# Patient Record
Sex: Male | Born: 1955 | Race: White | Hispanic: No | Marital: Married | Smoking: Never smoker
Health system: Southern US, Community
[De-identification: ages and names within clinical notes are randomized; demographics above are authoritative.]

## PROBLEM LIST (undated history)

## (undated) DIAGNOSIS — I110 Hypertensive heart disease with heart failure: Secondary | ICD-10-CM

## (undated) DIAGNOSIS — Z789 Other specified health status: Secondary | ICD-10-CM

## (undated) DIAGNOSIS — I5022 Chronic systolic (congestive) heart failure: Secondary | ICD-10-CM

## (undated) DIAGNOSIS — E8881 Metabolic syndrome: Secondary | ICD-10-CM

## (undated) DIAGNOSIS — N200 Calculus of kidney: Secondary | ICD-10-CM

## (undated) DIAGNOSIS — E785 Hyperlipidemia, unspecified: Secondary | ICD-10-CM

## (undated) HISTORY — DX: Calculus of kidney: N20.0

## (undated) HISTORY — DX: Hypertensive heart disease with heart failure: I11.0

## (undated) HISTORY — DX: Chronic systolic (congestive) heart failure: I50.22

## (undated) HISTORY — DX: Metabolic syndrome: E88.81

## (undated) HISTORY — DX: Hyperlipidemia, unspecified: E78.5

## (undated) HISTORY — DX: Other specified health status: Z78.9

---

## 1974-02-06 HISTORY — PX: HUMERUS FRACTURE SURGERY: SHX670

## 1974-02-06 HISTORY — PX: FOREARM FRACTURE SURGERY: SHX649

## 2005-02-06 HISTORY — PX: BACK SURGERY: SHX140

## 2016-07-04 DIAGNOSIS — Z789 Other specified health status: Secondary | ICD-10-CM

## 2016-07-04 HISTORY — DX: Other specified health status: Z78.9

## 2017-08-02 DIAGNOSIS — E785 Hyperlipidemia, unspecified: Secondary | ICD-10-CM | POA: Insufficient documentation

## 2017-08-02 DIAGNOSIS — E8881 Metabolic syndrome: Secondary | ICD-10-CM

## 2017-08-02 DIAGNOSIS — N200 Calculus of kidney: Secondary | ICD-10-CM

## 2017-08-02 DIAGNOSIS — I10 Essential (primary) hypertension: Secondary | ICD-10-CM

## 2017-08-02 DIAGNOSIS — I509 Heart failure, unspecified: Secondary | ICD-10-CM

## 2017-08-02 DIAGNOSIS — I5022 Chronic systolic (congestive) heart failure: Secondary | ICD-10-CM | POA: Insufficient documentation

## 2017-08-02 DIAGNOSIS — I110 Hypertensive heart disease with heart failure: Secondary | ICD-10-CM | POA: Insufficient documentation

## 2017-08-02 HISTORY — DX: Chronic systolic (congestive) heart failure: I50.22

## 2017-08-02 HISTORY — DX: Metabolic syndrome: E88.810

## 2017-08-02 HISTORY — DX: Calculus of kidney: N20.0

## 2017-08-02 HISTORY — DX: Hypertensive heart disease with heart failure: I11.0

## 2017-08-02 HISTORY — DX: Metabolic syndrome: E88.81

## 2017-08-02 HISTORY — DX: Heart failure, unspecified: I50.9

## 2017-08-02 HISTORY — DX: Hyperlipidemia, unspecified: E78.5

## 2017-08-02 HISTORY — DX: Essential (primary) hypertension: I10

## 2017-08-17 ENCOUNTER — Ambulatory Visit: Payer: Self-pay | Admitting: Cardiology

## 2017-09-04 ENCOUNTER — Encounter: Payer: Self-pay | Admitting: *Deleted

## 2017-09-11 NOTE — Progress Notes (Signed)
Cardiology Office Note:    Date:  09/12/2017   ID:  Victor Hood, DOB 12/12/1955, MRN [id number]  PCP:  Buckner Malta, MD  Cardiologist:  Norman Herrlich, MD   Referring MD: Buckner Malta, MD  ASSESSMENT:    1. Chronic left systolic heart failure (HCC)   2. Hypertensive heart disease with heart failure (HCC)   3. Hyperlipidemia, unspecified hyperlipidemia type    PLAN:    In order of problems listed above:  1. Stable his heart failure is nicely compensated New York Heart Association class I-II he has no fluid overload continue current treatment including loop diuretic beta-blocker ACE inhibitor recheck echocardiogram and BNP level.  If EF remains less than 40% he would benefit from transition ACE inhibitor to ARNI.  He will continue his sodiumrestriction and self-management 2. Stable blood pressure target continue current treatment check renal function at risk for hypokalemia and renal insufficiency of loop diuretic and ACE 3. Although not listed either in our office or his PCP office he is taking unknown dose of pravastatin for hyperlipidemia check liver function for safety LDL for efficacy.  He is having trouble tolerating it and I asked him to bring it to the office next time so we know his dose and can look at other alternatives depending on his LDL cholesterol  Next appointment 6 weeks   Medication Adjustments/Labs and Tests Ordered: Current medicines are reviewed at length with the patient today.  Concerns regarding medicines are outlined above.  No orders of the defined types were placed in this encounter.  No orders of the defined types were placed in this encounter.    Chief Complaint  Patient presents with  . Establish Care  . Congestive Heart Failure  . Hypertension  6 weeks  History of Present Illness:    Victor Hood is a 62 y.o. male who is being seen today for the evaluation of heart failure at the request of Buckner Malta, MD. I had seen him in  the range of 3 to 4 years ago outside of the epic system for heart failure he was treated guideline medical therapy and is done well and has had no shortness of breath edema hospitalization chest pain palpitation or syncope.  He has good healthcare literacy sodium restricts weighs daily and is compliant with medications.  He has had no recent cardiac testing or laboratory test performed.  He has no known valvular disease or CAD.  He has had no chest pain or exercise intolerance no palpitation or syncope  Past Medical History:  Diagnosis Date  . Chronic left systolic heart failure (HCC) 08/02/2017  . Hyperlipidemia 08/02/2017  . Hypertensive heart disease with heart failure (HCC) 08/02/2017  . Kidney stone 08/02/2017  . Metabolic syndrome 08/02/2017  . Statin intolerance 07/04/2016    Past Surgical History:  Procedure Laterality Date  . BACK SURGERY  2007   decrompressed herniated disc  . FOREARM FRACTURE SURGERY Left 1976  . HUMERUS FRACTURE SURGERY Left 1976    Current Medications: Current Meds  Medication Sig  . aspirin EC 81 MG tablet Take 81 mg by mouth daily.  . furosemide (LASIX) 40 MG tablet Take 40 mg by mouth daily.  Marland Kitchen lisinopril (PRINIVIL,ZESTRIL) 20 MG tablet Take 40 mg by mouth daily.  . metoprolol tartrate (LOPRESSOR) 25 MG tablet Take 25 mg by mouth 2 (two) times daily.     Allergies:   Tape   Social History   Socioeconomic History  . Marital status: Single  Spouse name: Not on file  . Number of children: Not on file  . Years of education: Not on file  . Highest education level: Not on file  Occupational History  . Not on file  Social Needs  . Financial resource strain: Not on file  . Food insecurity:    Worry: Not on file    Inability: Not on file  . Transportation needs:    Medical: Not on file    Non-medical: Not on file  Tobacco Use  . Smoking status: Never Smoker  . Smokeless tobacco: Never Used  Substance and Sexual Activity  . Alcohol use: Yes     Alcohol/week: 4.2 - 8.4 oz    Types: 7 - 14 Cans of beer per week  . Drug use: Not on file  . Sexual activity: Not on file  Lifestyle  . Physical activity:    Days per week: Not on file    Minutes per session: Not on file  . Stress: Not on file  Relationships  . Social connections:    Talks on phone: Not on file    Gets together: Not on file    Attends religious service: Not on file    Active member of club or organization: Not on file    Attends meetings of clubs or organizations: Not on file    Relationship status: Not on file  Other Topics Concern  . Not on file  Social History Narrative  . Not on file     Family History: The patient's family history includes CVA in his mother; Congestive Heart Failure in his father and mother; Diabetes in his mother; Hypercholesterolemia in his maternal grandfather, maternal grandmother, and mother; Pancreatic cancer in his paternal uncle; Prostate cancer in his paternal uncle.  ROS:   Review of Systems  Constitution: Negative.  HENT: Negative.   Eyes: Negative.   Cardiovascular: Negative.   Respiratory: Negative.   Endocrine: Negative.   Hematologic/Lymphatic: Negative.   Skin: Negative.   Musculoskeletal: Positive for myalgias.  Gastrointestinal: Negative.   Genitourinary: Negative.   Neurological: Negative.   Psychiatric/Behavioral: Negative.   Allergic/Immunologic: Negative.    Please see the history of present illness.     All other systems reviewed and are negative.  EKGs/Labs/Other Studies Reviewed:    The following studies were reviewed today:   EKG:  EKG is  ordered today.  The ekg ordered today demonstrates sinus rhythm and is normal I had seen him in the range of  Recent Labs: No results found for requested labs within last 8760 hours.  Recent Lipid Panel No results found for: CHOL, TRIG, HDL, CHOLHDL, VLDL, LDLCALC, LDLDIRECT  Physical Exam:    VS:  BP 140/86 (BP Location: Left Arm, Patient Position:  Sitting, Cuff Size: Normal)   Pulse (!) 53   Ht 5' 9.5" (1.765 m)   Wt 192 lb (87.1 kg)   SpO2 94%   BMI 27.95 kg/m     Wt Readings from Last 3 Encounters:  09/12/17 192 lb (87.1 kg)     GEN:  Well nourished, well developed in no acute distress HEENT: Normal NECK: No JVD; No carotid bruits LYMPHATICS: No lymphadenopathy CARDIAC: RRR, no murmurs, rubs, gallops RESPIRATORY:  Clear to auscultation without rales, wheezing or rhonchi  ABDOMEN: Soft, non-tender, non-distended MUSCULOSKELETAL:  No edema; No deformity  SKIN: Warm and dry NEUROLOGIC:  Alert and oriented x 3 PSYCHIATRIC:  Normal affect     Signed, Norman Herrlich, MD  09/12/2017 10:26  AM    [place]

## 2017-09-12 ENCOUNTER — Ambulatory Visit (INDEPENDENT_AMBULATORY_CARE_PROVIDER_SITE_OTHER): Payer: Self-pay | Admitting: Cardiology

## 2017-09-12 ENCOUNTER — Encounter: Payer: Self-pay | Admitting: Cardiology

## 2017-09-12 VITALS — BP 140/86 | HR 53 | Ht 69.5 in | Wt 192.0 lb

## 2017-09-12 DIAGNOSIS — I5022 Chronic systolic (congestive) heart failure: Secondary | ICD-10-CM

## 2017-09-12 DIAGNOSIS — E785 Hyperlipidemia, unspecified: Secondary | ICD-10-CM

## 2017-09-12 DIAGNOSIS — I110 Hypertensive heart disease with heart failure: Secondary | ICD-10-CM

## 2017-09-12 LAB — LIPID PANEL
CHOL/HDL RATIO: 6.1 ratio — AB (ref 0.0–5.0)
Cholesterol, Total: 196 mg/dL (ref 100–199)
HDL: 32 mg/dL — ABNORMAL LOW (ref >39)
LDL CALC: 112 mg/dL — AB (ref 0–99)
TRIGLYCERIDES: 262 mg/dL — AB (ref 0–149)
VLDL Cholesterol Cal: 52 mg/dL — ABNORMAL HIGH (ref 5–40)

## 2017-09-12 LAB — COMPREHENSIVE METABOLIC PANEL
A/G RATIO: 1.4 (ref 1.2–2.2)
ALBUMIN: 4.1 g/dL (ref 3.6–4.8)
ALT: 11 IU/L (ref 0–44)
AST: 11 IU/L (ref 0–40)
Alkaline Phosphatase: 93 IU/L (ref 39–117)
BUN / CREAT RATIO: 14 (ref 10–24)
BUN: 14 mg/dL (ref 8–27)
Bilirubin Total: 0.5 mg/dL (ref 0.0–1.2)
CALCIUM: 9.6 mg/dL (ref 8.6–10.2)
CHLORIDE: 103 mmol/L (ref 96–106)
CO2: 24 mmol/L (ref 20–29)
Creatinine, Ser: 1.03 mg/dL (ref 0.76–1.27)
GFR, EST AFRICAN AMERICAN: 89 mL/min/{1.73_m2} (ref >59)
GFR, EST NON AFRICAN AMERICAN: 77 mL/min/{1.73_m2} (ref >59)
GLOBULIN, TOTAL: 2.9 g/dL (ref 1.5–4.5)
Glucose: 106 mg/dL — ABNORMAL HIGH (ref 65–99)
POTASSIUM: 4.6 mmol/L (ref 3.5–5.2)
SODIUM: 142 mmol/L (ref 134–144)
TOTAL PROTEIN: 7 g/dL (ref 6.0–8.5)

## 2017-09-12 LAB — PRO B NATRIURETIC PEPTIDE: NT-Pro BNP: 57 pg/mL (ref 0–210)

## 2017-09-12 NOTE — Patient Instructions (Signed)
Medication Instructions:  Your physician recommends that you continue on your current medications as directed. Please refer to the Current Medication list given to you today.   Labwork: Your physician recommends that you return for lab work today: CMP, BNP, and Lipids.  Testing/Procedures: Your physician has requested that you have an echocardiogram. Echocardiography is a painless test that uses sound waves to create images of your heart. It provides your doctor with information about the size and shape of your heart and how well your heart's chambers and valves are working. This procedure takes approximately one hour. There are no restrictions for this procedure.    Follow-Up: Your physician recommends that you schedule a follow-up appointment in: 6 weeks.    Any Other Special Instructions Will Be Listed Below (If Applicable).     If you need a refill on your cardiac medications before your next appointment, please call your pharmacy.  Echocardiogram An echocardiogram, or echocardiography, uses sound waves (ultrasound) to produce an image of your heart. The echocardiogram is simple, painless, obtained within a short period of time, and offers valuable information to your health care provider. The images from an echocardiogram can provide information such as:  Evidence of coronary artery disease (CAD).  Heart size.  Heart muscle function.  Heart valve function.  Aneurysm detection.  Evidence of a past heart attack.  Fluid buildup around the heart.  Heart muscle thickening.  Assess heart valve function.  Tell a health care provider about:  Any allergies you have.  All medicines you are taking, including vitamins, herbs, eye drops, creams, and over-the-counter medicines.  Any problems you or family members have had with anesthetic medicines.  Any blood disorders you have.  Any surgeries you have had.  Any medical conditions you have.  Whether you are pregnant  or may be pregnant. What happens before the procedure? No special preparation is needed. Eat and drink normally. What happens during the procedure?  In order to produce an image of your heart, gel will be applied to your chest and a wand-like tool (transducer) will be moved over your chest. The gel will help transmit the sound waves from the transducer. The sound waves will harmlessly bounce off your heart to allow the heart images to be captured in real-time motion. These images will then be recorded.  You may need an IV to receive a medicine that improves the quality of the pictures. What happens after the procedure? You may return to your normal schedule including diet, activities, and medicines, unless your health care provider tells you otherwise. This information is not intended to replace advice given to you by your health care provider. Make sure you discuss any questions you have with your health care provider. Document Released: 01/21/2000 Document Revised: 09/11/2015 Document Reviewed: 09/30/2012 Elsevier Interactive Patient Education  2017 [employer].

## 2017-10-25 ENCOUNTER — Other Ambulatory Visit: Payer: Self-pay

## 2017-10-29 ENCOUNTER — Ambulatory Visit: Payer: Self-pay | Admitting: Cardiology

## 2017-12-06 ENCOUNTER — Other Ambulatory Visit: Payer: Self-pay

## 2017-12-11 NOTE — Progress Notes (Deleted)
Cardiology Office Note:    Date:  12/11/2017   ID:  Victor Hood, DOB Dec 23, 1955, MRN [id number]  PCP:  Buckner Malta, MD  Cardiologist:  Norman Herrlich, MD    Referring MD: Buckner Malta, MD    ASSESSMENT:    No diagnosis found. PLAN:    In order of problems listed above:  1. ***   Next appointment: ***   Medication Adjustments/Labs and Tests Ordered: Current medicines are reviewed at length with the patient today.  Concerns regarding medicines are outlined above.  No orders of the defined types were placed in this encounter.  No orders of the defined types were placed in this encounter.   No chief complaint on file.   History of Present Illness:    Victor Hood is a 62 y.o. male with a hx of heart failure pretension and hyper lipidemia last seen 09/12/2017.  proBNP level is very low less than 100 and echocardiogram was ordered but not performed Compliance with diet, lifestyle and medications: *** Past Medical History:  Diagnosis Date  . Chronic left systolic heart failure (HCC) 08/02/2017  . Hyperlipidemia 08/02/2017  . Hypertensive heart disease with heart failure (HCC) 08/02/2017  . Kidney stone 08/02/2017  . Metabolic syndrome 08/02/2017  . Statin intolerance 07/04/2016    Past Surgical History:  Procedure Laterality Date  . BACK SURGERY  2007   decrompressed herniated disc  . FOREARM FRACTURE SURGERY Left 1976  . HUMERUS FRACTURE SURGERY Left 1976    Current Medications: No outpatient medications have been marked as taking for the 12/12/17 encounter (Appointment) with Baldo Daub, MD.     Allergies:   Tape   Social History   Socioeconomic History  . Marital status: Single    Spouse name: Not on file  . Number of children: Not on file  . Years of education: Not on file  . Highest education level: Not on file  Occupational History  . Not on file  Social Needs  . Financial resource strain: Not on file  . Food insecurity:    Worry:  Not on file    Inability: Not on file  . Transportation needs:    Medical: Not on file    Non-medical: Not on file  Tobacco Use  . Smoking status: Never Smoker  . Smokeless tobacco: Never Used  Substance and Sexual Activity  . Alcohol use: Yes    Alcohol/week: 7.0 - 14.0 standard drinks    Types: 7 - 14 Cans of beer per week  . Drug use: Not on file  . Sexual activity: Not on file  Lifestyle  . Physical activity:    Days per week: Not on file    Minutes per session: Not on file  . Stress: Not on file  Relationships  . Social connections:    Talks on phone: Not on file    Gets together: Not on file    Attends religious service: Not on file    Active member of club or organization: Not on file    Attends meetings of clubs or organizations: Not on file    Relationship status: Not on file  Other Topics Concern  . Not on file  Social History Narrative  . Not on file     Family History: The patient's ***family history includes CVA in his mother; Congestive Heart Failure in his father and mother; Diabetes in his mother; Hypercholesterolemia in his maternal grandfather, maternal grandmother, and mother; Pancreatic cancer in his paternal uncle; Prostate  cancer in his paternal uncle. ROS:   Please see the history of present illness.    All other systems reviewed and are negative.  EKGs/Labs/Other Studies Reviewed:    The following studies were reviewed today:  EKG:  EKG ordered today.  The ekg ordered today demonstrates ***  Recent Labs: 09/12/2017: ALT 11; BUN 14; Creatinine, Ser 1.03; NT-Pro BNP 57; Potassium 4.6; Sodium 142  Recent Lipid Panel    Component Value Date/Time   CHOL 196 09/12/2017 1034   TRIG 262 (H) 09/12/2017 1034   HDL 32 (L) 09/12/2017 1034   CHOLHDL 6.1 (H) 09/12/2017 1034   LDLCALC 112 (H) 09/12/2017 1034    Physical Exam:    VS:  There were no vitals taken for this visit.    Wt Readings from Last 3 Encounters:  09/12/17 192 lb (87.1 kg)      GEN: *** Well nourished, well developed in no acute distress HEENT: Normal NECK: No JVD; No carotid bruits LYMPHATICS: No lymphadenopathy CARDIAC: ***RRR, no murmurs, rubs, gallops RESPIRATORY:  Clear to auscultation without rales, wheezing or rhonchi  ABDOMEN: Soft, non-tender, non-distended MUSCULOSKELETAL:  No edema; No deformity  SKIN: Warm and dry NEUROLOGIC:  Alert and oriented x 3 PSYCHIATRIC:  Normal affect    Signed, Norman Herrlich, MD  12/11/2017 3:41 PM    [facility]

## 2017-12-12 ENCOUNTER — Ambulatory Visit: Payer: Self-pay | Admitting: Cardiology

## 2018-12-09 ENCOUNTER — Other Ambulatory Visit: Payer: Self-pay | Admitting: Cardiology

## 2018-12-09 DIAGNOSIS — I5022 Chronic systolic (congestive) heart failure: Secondary | ICD-10-CM

## 2018-12-09 DIAGNOSIS — I110 Hypertensive heart disease with heart failure: Secondary | ICD-10-CM

## 2019-08-27 DIAGNOSIS — Z9181 History of falling: Secondary | ICD-10-CM | POA: Diagnosis not present

## 2019-08-27 DIAGNOSIS — E7800 Pure hypercholesterolemia, unspecified: Secondary | ICD-10-CM | POA: Diagnosis not present

## 2019-08-27 DIAGNOSIS — Z1331 Encounter for screening for depression: Secondary | ICD-10-CM | POA: Diagnosis not present

## 2019-08-27 DIAGNOSIS — Z789 Other specified health status: Secondary | ICD-10-CM | POA: Diagnosis not present

## 2019-08-27 DIAGNOSIS — I10 Essential (primary) hypertension: Secondary | ICD-10-CM | POA: Diagnosis not present

## 2019-08-27 DIAGNOSIS — I5022 Chronic systolic (congestive) heart failure: Secondary | ICD-10-CM | POA: Diagnosis not present

## 2019-09-05 ENCOUNTER — Encounter: Payer: Self-pay | Admitting: General Practice

## 2019-10-07 DIAGNOSIS — Z0000 Encounter for general adult medical examination without abnormal findings: Secondary | ICD-10-CM | POA: Diagnosis not present

## 2019-11-10 ENCOUNTER — Other Ambulatory Visit: Payer: Self-pay

## 2019-11-10 ENCOUNTER — Encounter: Payer: Self-pay | Admitting: Cardiology

## 2019-11-10 ENCOUNTER — Ambulatory Visit (INDEPENDENT_AMBULATORY_CARE_PROVIDER_SITE_OTHER): Payer: PPO | Admitting: Cardiology

## 2019-11-10 VITALS — BP 156/95 | HR 72 | Ht 69.5 in | Wt 190.8 lb

## 2019-11-10 DIAGNOSIS — I110 Hypertensive heart disease with heart failure: Secondary | ICD-10-CM | POA: Diagnosis not present

## 2019-11-10 DIAGNOSIS — I5022 Chronic systolic (congestive) heart failure: Secondary | ICD-10-CM | POA: Diagnosis not present

## 2019-11-10 DIAGNOSIS — E785 Hyperlipidemia, unspecified: Secondary | ICD-10-CM

## 2019-11-10 NOTE — Progress Notes (Signed)
Cardiology Office Note:    Date:  11/10/2019   ID:  Victor Hood, DOB [birth year], MRN [id number]  PCP:  Buckner Malta, MD  Cardiologist:  Norman Herrlich, MD    Referring MD: Buckner Malta, MD    ASSESSMENT:    1. Chronic left systolic heart failure (HCC)   2. Hypertensive heart disease with heart failure (HCC)   3. Hyperlipidemia, unspecified hyperlipidemia type    PLAN:    In order of problems listed above:  1. Overall he is done well with noncompliance of medications since his last visit.  I told him I suspect with an abnormal EKG and symptoms that his left ventricular function is not improved may be worsened and requires guideline directed treatment.  For further evaluation we will check a proBNP level and echocardiogram.  If EF remains reduced appropriate therapy would include MRA Entresto SGLT2 inhibitor and a loop diuretic if he remains symptomatic or interested volume overload. 2. Repeat blood pressure by me 142/80 in the office I encouraged him to check his blood pressure at home and await the results of testing for decision on other treatment which would impact his blood pressure such as Entresto and MRA 3. Not presently on a statin   Next appointment: 6 weeks or sooner depending on the results of testing   Medication Adjustments/Labs and Tests Ordered: Current medicines are reviewed at length with the patient today.  Concerns regarding medicines are outlined above.  Orders Placed This Encounter  Procedures  . Basic metabolic panel  . Pro b natriuretic peptide (BNP)  . EKG 12-Lead  . ECHOCARDIOGRAM COMPLETE   No orders of the defined types were placed in this encounter.   Chief Complaint  Patient presents with  . Follow-up  . Congestive Heart Failure    History of Present Illness:    Victor Hood is a 64 y.o. male with a hx of chronic systolic heart failure hypertensive heart disease and hyperlipidemia and statin intolerance.  He was last seen  09/12/2017.  At that time echocardiogram was ordered to guide therapy he did not follow-up to have the test performed or in the office afterwards.  Compliance with diet, lifestyle and medications: yes  He is here for multiple reasons, sent by his PCP after an EKG was done in the office EKG here today shows left bundle branch block. Soon after seeing me he stopped his diuretic and beta-blocker. 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 He has no edema orthopnea no chest pain palpitation or syncope. 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  Past Medical History:  Diagnosis Date  . Chronic left systolic heart failure (HCC) 08/02/2017  . Hyperlipidemia 08/02/2017  . Hypertensive heart disease with heart failure (HCC) 08/02/2017  . Kidney stone 08/02/2017  . Metabolic syndrome 08/02/2017  . Statin intolerance 07/04/2016    Past Surgical History:  Procedure Laterality Date  . BACK SURGERY  2007   decrompressed herniated disc  . FOREARM FRACTURE SURGERY Left 1976  . HUMERUS FRACTURE SURGERY Left 1976    Current Medications: Current Meds  Medication Sig  . aspirin EC 81 MG tablet Take 81 mg by mouth daily.  Marland Kitchen  lisinopril (PRINIVIL,ZESTRIL) 20 MG tablet Take 40 mg by mouth daily.     Allergies:   Tape   Social History   Socioeconomic History  . Marital status: Single    Spouse name: Not on file  . Number of children: Not on file  . Years of education: Not on file  . Highest education level: Not on file  Occupational History  . Not on file  Tobacco Use  . Smoking status: Never Smoker  . Smokeless tobacco: Never Used  Substance and Sexual Activity  . Alcohol use: Yes     Alcohol/week: 7.0 - 14.0 standard drinks    Types: 7 - 14 Cans of beer per week  . Drug use: Not on file  . Sexual activity: Not on file  Other Topics Concern  . Not on file  Social History Narrative  . Not on file   Social Determinants of Health   Financial Resource Strain:   . Difficulty of Paying Living Expenses: Not on file  Food Insecurity:   . Worried About Programme researcher, broadcasting/film/video in the Last Year: Not on file  . Ran Out of Food in the Last Year: Not on file  Transportation Needs:   . Lack of Transportation (Medical): Not on file  . Lack of Transportation (Non-Medical): Not on file  Physical Activity:   . Days of Exercise per Week: Not on file  . Minutes of Exercise per Session: Not on file  Stress:   . Feeling of Stress : Not on file  Social Connections:   . Frequency of Communication with Friends and Family: Not on file  . Frequency of Social Gatherings with Friends and Family: Not on file  . Attends Religious Services: Not on file  . Active Member of Clubs or Organizations: Not on file  . Attends Banker Meetings: Not on file  . Marital Status: Not on file     Family History: The patient's family history includes CVA in his mother; Congestive Heart Failure in his father and mother; Diabetes in his mother; Hypercholesterolemia in his maternal grandfather, maternal grandmother, and mother; Pancreatic cancer in his paternal uncle; Prostate cancer in his paternal uncle. ROS:   Please see the history of present illness.    All other systems reviewed and are negative.  EKGs/Labs/Other Studies Reviewed:    The following studies were reviewed today:  EKG:  EKG ordered today and personally reviewed.  The ekg ordered today demonstrates Yes  Recent Labs: No results found for requested labs within last 8760 hours.  Recent Lipid Panel    Component Value Date/Time   CHOL 196 09/12/2017 1034   TRIG 262 (H) 09/12/2017 1034   HDL 32 (L) 09/12/2017 1034   CHOLHDL  6.1 (H) 09/12/2017 1034   LDLCALC 112 (H) 09/12/2017 1034    Physical Exam:    VS:  BP (!) 156/95   Pulse 72   Ht 5' 9.5" (1.765 m)   Wt 190 lb 12.8 oz (86.5 kg)   SpO2 96%   BMI 27.77 kg/m     Wt Readings from Last 3 Encounters:  11/10/19 190 lb 12.8 oz (86.5 kg)  09/12/17 192 lb (87.1 kg)     GEN:  Well nourished, well developed in no acute distress HEENT: Normal NECK: No JVD; No carotid bruits LYMPHATICS: No lymphadenopathy CARDIAC: RRR, no murmurs, rubs, gallops RESPIRATORY:  Clear to auscultation without rales, wheezing or rhonchi  ABDOMEN: Soft, non-tender, non-distended MUSCULOSKELETAL:  No edema; No  deformity  SKIN: Warm and dry NEUROLOGIC:  Alert and oriented x 3 PSYCHIATRIC:  Normal affect    Signed, Norman Herrlich, MD  11/10/2019 4:52 PM    [facility]

## 2019-11-10 NOTE — Patient Instructions (Signed)
Medication Instructions:  Your physician recommends that you continue on your current medications as directed. Please refer to the Current Medication list given to you today.  *If you need a refill on your cardiac medications before your next appointment, please call your pharmacy*   Lab Work: Your physician recommends that you return for lab work in: TODAY BMP, ProBNP If you have labs (blood work) drawn today and your tests are completely normal, you will receive your results only by: . MyChart Message (if you have MyChart) OR . A paper copy in the mail If you have any lab test that is abnormal or we need to change your treatment, we will call you to review the results.   Testing/Procedures: Your physician has requested that you have an echocardiogram. Echocardiography is a painless test that uses sound waves to create images of your heart. It provides your doctor with information about the size and shape of your heart and how well your heart's chambers and valves are working. This procedure takes approximately one hour. There are no restrictions for this procedure.     Follow-Up: At [facility], you and your health needs are our priority.  As part of our continuing mission to provide you with exceptional heart care, we have created designated Provider Care Teams.  These Care Teams include your primary Cardiologist (physician) and Advanced Practice Providers (APPs -  Physician Assistants and Nurse Practitioners) who all work together to provide you with the care you need, when you need it.  We recommend signing up for the patient portal called "MyChart".  Sign up information is provided on this After Visit Summary.  MyChart is used to connect with patients for Virtual Visits (Telemedicine).  Patients are able to view lab/test results, encounter notes, upcoming appointments, etc.  Non-urgent messages can be sent to your provider as well.   To learn more about what you can do with MyChart,  go to [email or web address].    Your next appointment:   6 week(s)  The format for your next appointment:   In Person  Provider:   Brian Munley, MD   Other Instructions   

## 2019-11-11 LAB — BASIC METABOLIC PANEL
BUN/Creatinine Ratio: 9 — ABNORMAL LOW (ref 10–24)
BUN: 10 mg/dL (ref 8–27)
CO2: 22 mmol/L (ref 20–29)
Calcium: 9.5 mg/dL (ref 8.6–10.2)
Chloride: 102 mmol/L (ref 96–106)
Creatinine, Ser: 1.1 mg/dL (ref 0.76–1.27)
GFR calc Af Amer: 81 mL/min/{1.73_m2} (ref >59)
GFR calc non Af Amer: 70 mL/min/{1.73_m2} (ref >59)
Glucose: 88 mg/dL (ref 65–99)
Potassium: 5.1 mmol/L (ref 3.5–5.2)
Sodium: 140 mmol/L (ref 134–144)

## 2019-11-11 LAB — PRO B NATRIURETIC PEPTIDE: NT-Pro BNP: 500 pg/mL — ABNORMAL HIGH (ref 0–376)

## 2019-12-02 ENCOUNTER — Other Ambulatory Visit: Payer: Self-pay

## 2019-12-02 ENCOUNTER — Ambulatory Visit (INDEPENDENT_AMBULATORY_CARE_PROVIDER_SITE_OTHER): Payer: PPO

## 2019-12-02 DIAGNOSIS — I5022 Chronic systolic (congestive) heart failure: Secondary | ICD-10-CM | POA: Diagnosis not present

## 2019-12-02 DIAGNOSIS — E785 Hyperlipidemia, unspecified: Secondary | ICD-10-CM | POA: Diagnosis not present

## 2019-12-02 DIAGNOSIS — I110 Hypertensive heart disease with heart failure: Secondary | ICD-10-CM | POA: Diagnosis not present

## 2019-12-02 LAB — ECHOCARDIOGRAM COMPLETE
Calc EF: 27.4 %
S' Lateral: 3.7 cm
Single Plane A2C EF: 28 %
Single Plane A4C EF: 28.4 %

## 2019-12-02 NOTE — Progress Notes (Signed)
Complete echocardiogram performed.  Jimmy Dim Meisinger RDCS, RVT  

## 2019-12-03 ENCOUNTER — Telehealth: Payer: Self-pay | Admitting: *Deleted

## 2019-12-03 ENCOUNTER — Telehealth: Payer: Self-pay | Admitting: Emergency Medicine

## 2019-12-03 DIAGNOSIS — Z79899 Other long term (current) drug therapy: Secondary | ICD-10-CM

## 2019-12-03 MED ORDER — CARVEDILOL 6.25 MG PO TABS: 6.2500 mg | ORAL_TABLET | Freq: Two times a day (BID) | ORAL | 1 refills | Status: DC

## 2019-12-03 MED ORDER — ENTRESTO 24-26 MG PO TABS: 1.0000 | ORAL_TABLET | Freq: Two times a day (BID) | ORAL | 2 refills | Status: DC

## 2019-12-03 NOTE — Telephone Encounter (Signed)
PA for entresto came back Approved. Entresto 24 MG-26 MG TABLET will be approved from 12/03/19 to 12/02/2020

## 2019-12-03 NOTE — Telephone Encounter (Signed)
Called patient. Informed her of results. Informed him to start carvedilol 6.25 mg twice daily.   Informed him to stop lisinopril. Patient advised he must be off lisinopril for 3 days before he can start entresto. On the 4th day patient advised to start entresto 24/26 mg twice daily.   Patient informed to come have labs drawn in 1 week after being on meds. He verbally understood. No further questions.

## 2019-12-03 NOTE — Telephone Encounter (Signed)
A prior Berkley Harvey has been started for [facility] 24-26 on this pt, Key: [id number]

## 2019-12-03 NOTE — Telephone Encounter (Signed)
-----   Message from Thomasene Ripple, DO sent at 12/02/2019  9:45 PM EDT ----- Start the patient on carvedilol 6.25 mg twice daily.    Ask him to stop his lisinopril wait for 3 days for 3 days.  On day 4, I would like him to start Entresto 24/26 mg twice daily.  He will no longer be on lisinopril.Please have the patient come next week for BMP magnesium.Tell him to keep his upcoming appointment with Dr. Dulce Sellar.

## 2019-12-03 NOTE — Telephone Encounter (Signed)
Spoke with pt's wife and informed her pt has been approved for the [facility]. She stated that [facility] had already notified them of the approval. She thanked me and stated they would get the medication filled.

## 2019-12-24 ENCOUNTER — Ambulatory Visit: Payer: PPO | Admitting: Cardiology

## 2019-12-24 ENCOUNTER — Telehealth: Payer: Self-pay | Admitting: Cardiology

## 2019-12-24 ENCOUNTER — Other Ambulatory Visit: Payer: Self-pay

## 2019-12-24 ENCOUNTER — Encounter: Payer: Self-pay | Admitting: Cardiology

## 2019-12-24 VITALS — BP 158/58 | HR 72 | Ht 69.5 in | Wt 193.2 lb

## 2019-12-24 DIAGNOSIS — I429 Cardiomyopathy, unspecified: Secondary | ICD-10-CM | POA: Diagnosis not present

## 2019-12-24 DIAGNOSIS — I5022 Chronic systolic (congestive) heart failure: Secondary | ICD-10-CM

## 2019-12-24 DIAGNOSIS — I110 Hypertensive heart disease with heart failure: Secondary | ICD-10-CM

## 2019-12-24 MED ORDER — CARVEDILOL 12.5 MG PO TABS: 12.5000 mg | ORAL_TABLET | Freq: Two times a day (BID) | ORAL | 3 refills | Status: DC

## 2019-12-24 MED ORDER — METOPROLOL TARTRATE 100 MG PO TABS: 100.0000 mg | ORAL_TABLET | Freq: Once | ORAL | 0 refills | Status: DC

## 2019-12-24 MED ORDER — DAPAGLIFLOZIN PROPANEDIOL 10 MG PO TABS: 10.0000 mg | ORAL_TABLET | Freq: Every day | ORAL | 3 refills | Status: DC

## 2019-12-24 MED ORDER — ENTRESTO 49-51 MG PO TABS: 1.0000 | ORAL_TABLET | Freq: Two times a day (BID) | ORAL | 3 refills | Status: DC

## 2019-12-24 NOTE — Patient Instructions (Addendum)
Medication Instructions:  Your physician has recommended you make the following change in your medication:  START: Farxiga 10 mg take one tablet by mouth daily.  INCREASE: Carvedilol 12.5 mg take one tablet by mouth twice daily.  INCREASE: Entresto 49/51 mg take one tablet by mouth twice daily.  *If you need a refill on your cardiac medications before your next appointment, please call your pharmacy*   Lab Work: Your physician recommends that you return for lab work in: Within one week of your cardiac CT BMP, ProBNP If you have labs (blood work) drawn today and your tests are completely normal, you will receive your results only by: Marland Kitchen MyChart Message (if you have MyChart) OR . A paper copy in the mail If you have any lab test that is abnormal or we need to change your treatment, we will call you to review the results.   Testing/Procedures: Your cardiac CT will be scheduled at the below location:   [facility] [address] [place], [place] [address] [phone]   If scheduled at [facility], please arrive at the [facility] main entrance of [facility] 30 minutes prior to test start time. Proceed to the [facility] Radiology Department (first floor) to check-in and test prep.   Please follow these instructions carefully (unless otherwise directed):  Hold all erectile dysfunction medications at least 3 days (72 hrs) prior to test.  On the Night Before the Test: . Be sure to Drink plenty of water. . Do not consume any caffeinated/decaffeinated beverages or chocolate 12 hours prior to your test. . Do not take any antihistamines 12 hours prior to your test.  On the Day of the Test: . Drink plenty of water. Do not drink any water within one hour of the test. . Do not eat any food 4 hours prior to the test. . You may take your regular medications prior to the test.  . Take metoprolol (Lopressor) two hours prior to test.       After the  Test: . Drink plenty of water. . After receiving IV contrast, you may experience a mild flushed feeling. This is normal. . On occasion, you may experience a mild rash up to 24 hours after the test. This is not dangerous. If this occurs, you can take Benadryl 25 mg and increase your fluid intake. . If you experience trouble breathing, this can be serious. If it is severe call 911 IMMEDIATELY. If it is mild, please call our office. . If you take any of these medications: Glipizide/Metformin, Avandament, Glucavance, please do not take 48 hours after completing test unless otherwise instructed.   Once we have confirmed authorization from your insurance company, we will call you to set up a date and time for your test. Based on how quickly your insurance processes prior authorizations requests, please allow up to 4 weeks to be contacted for scheduling your Cardiac CT appointment. Be advised that routine Cardiac CT appointments could be scheduled as many as 8 weeks after your provider has ordered it.  For non-scheduling related questions, please contact the cardiac imaging nurse navigator should you have any questions/concerns: Marchia Bond, Cardiac Imaging Nurse Navigator Burley Saver, Interim Cardiac Imaging Nurse [place] [facility] Direct Office Dial: [phone]   For scheduling needs, including cancellations and rescheduling, please call [place], [phone] (temporary number).      Follow-Up: At [facility], you and your health needs are our priority.  As part  of our continuing mission to provide you with exceptional heart care, we have created designated Provider Care Teams.  These Care Teams include your primary Cardiologist (physician) and Advanced Practice Providers (APPs -  Physician Assistants and Nurse Practitioners) who all work together to provide you with the care you need, when you need it.  We recommend signing up for the patient portal called  "MyChart".  Sign up information is provided on this After Visit Summary.  MyChart is used to connect with patients for Virtual Visits (Telemedicine).  Patients are able to view lab/test results, encounter notes, upcoming appointments, etc.  Non-urgent messages can be sent to your provider as well.   To learn more about what you can do with MyChart, go to [email or web address].    Your next appointment:   6 week(s)  The format for your next appointment:   In Person  Provider:   Shirlee More, MD   Other Instructions

## 2019-12-24 NOTE — Progress Notes (Signed)
Cardiology Office Note:    Date:  12/24/2019   ID:  Victor Hood, DOB Jun 14, 1955, MRN [id number]  PCP:  Buckner Malta, MD  Cardiologist:  Norman Herrlich, MD    Referring MD: Buckner Malta, MD    ASSESSMENT:    1. Chronic left systolic heart failure (HCC)   2. Hypertensive heart disease with heart failure (HCC)   3. Cardiomyopathy, unspecified type (HCC)    PLAN:    In order of problems listed above:  He has had a impressive symptomatic response to drug therapy will optimize increasing carvedilol and Entresto and asked SGLT2 inhibitor.  Hypertension still not at target follow guideline directed therapy to recheck renal function proBNP and for evaluation of cardiomyopathy cardiac CTA.  Next appointment: 6 weeks   Medication Adjustments/Labs and Tests Ordered: Current medicines are reviewed at length with the patient today.  Concerns regarding medicines are outlined above.  No orders of the defined types were placed in this encounter.  No orders of the defined types were placed in this encounter.   Chief Complaint  Patient presents with  . Follow-up  . Congestive Heart Failure  . Cardiomyopathy    History of Present Illness:    Victor Hood is a 64 y.o. male with a hx of chronic systolic heart failure hypertensive heart disease and hyperlipidemia and statin intolerance last seen 11/10/2019. Compliance with diet, lifestyle and medications: Yes  I reviewed his echocardiogram with him the trauma used with severe and he voiced understanding. Already he feels better on his current medications and has no exercise intolerance shortness of breath and strength and endurance are better he is enthusiastic about his response. We talked about optimizing treatment or increase his beta-blocker increase his Entresto and start an SGLT2 inhibitor. Recheck labs today BMP proBNP We discussed evaluation for CAD I think it is very important here in the best test his cardiac CTA and  will schedule I.  The indication will be cardiomyopathy  Echo 12/02/2019: Shows that he has severe left ventricular dysfunction 1. Left ventricular ejection fraction, by estimation, is 30 to 35%. The  left ventricle has moderately decreased function. The left ventricle has  no regional wall motion abnormalities. There is mild left ventricular  hypertrophy. Left ventricular  diastolic parameters are consistent with Grade I diastolic dysfunction  (impaired relaxation).  2. Right ventricular systolic function is normal. The right ventricular  size is normal. There is normal pulmonary artery systolic pressure.  3. Left atrial size was mildly dilated.  4. The mitral valve is normal in structure. Mild mitral valve  regurgitation. No evidence of mitral stenosis.  5. The aortic valve is normal in structure. Aortic valve regurgitation is  not visualized. No aortic stenosis is present.  6. The inferior vena cava is normal in size with greater than 50%  respiratory variability, suggesting right atrial pressure of 3 mmHg.   Ref Range & Units 1 mo ago 2 yr ago  NT-Pro BNP 0 - 376 pg/mL 500High  57 R, CM     Past Medical History:  Diagnosis Date  . Chronic left systolic heart failure (HCC) 08/02/2017  . Hyperlipidemia 08/02/2017  . Hypertensive heart disease with heart failure (HCC) 08/02/2017  . Kidney stone 08/02/2017  . Metabolic syndrome 08/02/2017  . Statin intolerance 07/04/2016    Past Surgical History:  Procedure Laterality Date  . BACK SURGERY  2007   decrompressed herniated disc  . FOREARM FRACTURE SURGERY Left 1976  . HUMERUS FRACTURE SURGERY Left 1976  Current Medications: Current Meds  Medication Sig  . [DISCONTINUED] carvedilol (COREG) 6.25 MG tablet Take 1 tablet (6.25 mg total) by mouth 2 (two) times daily.  . [DISCONTINUED] sacubitril-valsartan (ENTRESTO) 24-26 MG Take 1 tablet by mouth 2 (two) times daily.     Allergies:   Tape   Social History    Socioeconomic History  . Marital status: Single    Spouse name: Not on file  . Number of children: Not on file  . Years of education: Not on file  . Highest education level: Not on file  Occupational History  . Not on file  Tobacco Use  . Smoking status: Never Smoker  . Smokeless tobacco: Never Used  Substance and Sexual Activity  . Alcohol use: Yes    Alcohol/week: 7.0 - 14.0 standard drinks    Types: 7 - 14 Cans of beer per week  . Drug use: Not on file  . Sexual activity: Not on file  Other Topics Concern  . Not on file  Social History Narrative  . Not on file   Social Determinants of Health   Financial Resource Strain:   . Difficulty of Paying Living Expenses: Not on file  Food Insecurity:   . Worried About Programme researcher, broadcasting/film/video in the Last Year: Not on file  . Ran Out of Food in the Last Year: Not on file  Transportation Needs:   . Lack of Transportation (Medical): Not on file  . Lack of Transportation (Non-Medical): Not on file  Physical Activity:   . Days of Exercise per Week: Not on file  . Minutes of Exercise per Session: Not on file  Stress:   . Feeling of Stress : Not on file  Social Connections:   . Frequency of Communication with Friends and Family: Not on file  . Frequency of Social Gatherings with Friends and Family: Not on file  . Attends Religious Services: Not on file  . Active Member of Clubs or Organizations: Not on file  . Attends Banker Meetings: Not on file  . Marital Status: Not on file     Family History: The patient's family history includes CVA in his mother; Congestive Heart Failure in his father and mother; Diabetes in his mother; Hypercholesterolemia in his maternal grandfather, maternal grandmother, and mother; Pancreatic cancer in his paternal uncle; Prostate cancer in his paternal uncle. ROS:   Please see the history of present illness.    All other systems reviewed and are negative.  EKGs/Labs/Other Studies  Reviewed:    The following studies were reviewed today:   Recent Labs: 11/10/2019: BUN 10; Creatinine, Ser 1.10; NT-Pro BNP 500; Potassium 5.1; Sodium 140  Recent Lipid Panel    Component Value Date/Time   CHOL 196 09/12/2017 1034   TRIG 262 (H) 09/12/2017 1034   HDL 32 (L) 09/12/2017 1034   CHOLHDL 6.1 (H) 09/12/2017 1034   LDLCALC 112 (H) 09/12/2017 1034    Physical Exam:    VS:  BP (!) 158/58   Pulse 72   Ht 5' 9.5" (1.765 m)   Wt 193 lb 3.2 oz (87.6 kg)   SpO2 99%   BMI 28.12 kg/m     Wt Readings from Last 3 Encounters:  12/24/19 193 lb 3.2 oz (87.6 kg)  11/10/19 190 lb 12.8 oz (86.5 kg)  09/12/17 192 lb (87.1 kg)     GEN:  Well nourished, well developed in no acute distress HEENT: Normal NECK: No JVD; No carotid bruits LYMPHATICS:  No lymphadenopathy CARDIAC: RRR, no murmurs, rubs, gallops RESPIRATORY:  Clear to auscultation without rales, wheezing or rhonchi  ABDOMEN: Soft, non-tender, non-distended MUSCULOSKELETAL:  No edema; No deformity  SKIN: Warm and dry NEUROLOGIC:  Alert and oriented x 3 PSYCHIATRIC:  Normal affect    Signed, Norman Herrlich, MD  12/24/2019 10:05 AM    [facility]

## 2019-12-24 NOTE — Telephone Encounter (Signed)
  Patient called and wanted to know if he is to start the medication Dr. Dulce Sellar gave him today as soon as he gets it or wait until he runs out of his current medication. Please advise

## 2019-12-25 NOTE — Telephone Encounter (Signed)
Spoke to the patient just now and let him know that he could go ahead and make this medication changes. He verbalizes understanding and thanks me for the call back.

## 2020-01-13 ENCOUNTER — Telehealth (HOSPITAL_COMMUNITY): Payer: Self-pay | Admitting: *Deleted

## 2020-01-13 DIAGNOSIS — I5022 Chronic systolic (congestive) heart failure: Secondary | ICD-10-CM | POA: Diagnosis not present

## 2020-01-13 DIAGNOSIS — I110 Hypertensive heart disease with heart failure: Secondary | ICD-10-CM | POA: Diagnosis not present

## 2020-01-13 DIAGNOSIS — I429 Cardiomyopathy, unspecified: Secondary | ICD-10-CM | POA: Diagnosis not present

## 2020-01-13 NOTE — Telephone Encounter (Signed)
Reaching out to patient to offer assistance regarding upcoming cardiac imaging study; pt verbalizes understanding of appt date/time, parking situation and where to check in, pre-test NPO status and medications ordered, and verified current allergies; name and call back number provided for further questions should they arise  [place] [facility] [phone] office [phone] cell  Pt verbalized understanding to obtain blood work prior to arrival on Thursday December 7.

## 2020-01-14 ENCOUNTER — Telehealth: Payer: Self-pay

## 2020-01-14 LAB — PRO B NATRIURETIC PEPTIDE: NT-Pro BNP: 396 pg/mL — ABNORMAL HIGH (ref 0–376)

## 2020-01-14 LAB — BASIC METABOLIC PANEL
BUN/Creatinine Ratio: 9 — ABNORMAL LOW (ref 10–24)
BUN: 10 mg/dL (ref 8–27)
CO2: 26 mmol/L (ref 20–29)
Calcium: 9.4 mg/dL (ref 8.6–10.2)
Chloride: 102 mmol/L (ref 96–106)
Creatinine, Ser: 1.12 mg/dL (ref 0.76–1.27)
GFR calc Af Amer: 79 mL/min/{1.73_m2} (ref >59)
GFR calc non Af Amer: 69 mL/min/{1.73_m2} (ref >59)
Glucose: 95 mg/dL (ref 65–99)
Potassium: 5.1 mmol/L (ref 3.5–5.2)
Sodium: 140 mmol/L (ref 134–144)

## 2020-01-14 NOTE — Telephone Encounter (Signed)
Follow up   Pt is returning call Call transferred to [place]   

## 2020-01-14 NOTE — Telephone Encounter (Signed)
Spoke with patient regarding results and recommendation.  Patient verbalizes understanding and is agreeable to plan of care. Advised patient to call back with any issues or concerns.  

## 2020-01-14 NOTE — Telephone Encounter (Signed)
-----   Message from Baldo Daub, MD sent at 01/14/2020  7:46 AM EST ----- Good result no changes

## 2020-01-14 NOTE — Telephone Encounter (Signed)
Left message on patients voicemail to please return our call.   

## 2020-01-15 ENCOUNTER — Ambulatory Visit (HOSPITAL_COMMUNITY)
Admission: RE | Admit: 2020-01-15 | Discharge: 2020-01-15 | Disposition: A | Discharge status: Home / Self Care | Payer: PPO | Source: Ambulatory Visit | Attending: Cardiology | Admitting: Cardiology

## 2020-01-15 ENCOUNTER — Other Ambulatory Visit (HOSPITAL_COMMUNITY): Payer: PPO

## 2020-01-15 ENCOUNTER — Encounter (HOSPITAL_COMMUNITY): Payer: Self-pay

## 2020-01-15 ENCOUNTER — Other Ambulatory Visit: Payer: Self-pay

## 2020-01-15 DIAGNOSIS — I2510 Atherosclerotic heart disease of native coronary artery without angina pectoris: Secondary | ICD-10-CM | POA: Insufficient documentation

## 2020-01-15 DIAGNOSIS — I700 Atherosclerosis of aorta: Secondary | ICD-10-CM | POA: Diagnosis not present

## 2020-01-15 DIAGNOSIS — I429 Cardiomyopathy, unspecified: Secondary | ICD-10-CM | POA: Diagnosis not present

## 2020-01-15 MED ORDER — NITROGLYCERIN 0.4 MG SL SUBL
SUBLINGUAL_TABLET | SUBLINGUAL | Status: AC
  Filled 2020-01-15: qty 2

## 2020-01-15 MED ORDER — IOHEXOL 350 MG/ML SOLN
80.0000 mL | Freq: Once | INTRAVENOUS | Status: AC | PRN
  Administered 2020-01-15: 80 mL via INTRAVENOUS

## 2020-01-15 MED ORDER — NITROGLYCERIN 0.4 MG SL SUBL
0.8000 mg | SUBLINGUAL_TABLET | Freq: Once | SUBLINGUAL | Status: AC
  Administered 2020-01-15: 0.8 mg via SUBLINGUAL

## 2020-01-16 DIAGNOSIS — I2510 Atherosclerotic heart disease of native coronary artery without angina pectoris: Secondary | ICD-10-CM | POA: Diagnosis not present

## 2020-01-20 ENCOUNTER — Telehealth: Payer: Self-pay

## 2020-01-20 NOTE — Telephone Encounter (Signed)
Left message on patients voicemail to please return our call.   

## 2020-01-20 NOTE — Telephone Encounter (Signed)
-----   Message from Baldo Daub, MD sent at 01/19/2020  4:19 PM EST ----- Cardiac CTA is abnormal can we get him in the office to see me this week as I think he needs to undergo heart catheterization likely need stent.

## 2020-01-21 ENCOUNTER — Telehealth: Payer: Self-pay

## 2020-01-21 NOTE — Telephone Encounter (Signed)
-----   Message from Brian J Munley, MD sent at 01/19/2020  4:19 PM EST ----- Cardiac CTA is abnormal can we get him in the office to see me this week as I think he needs to undergo heart catheterization likely need stent. 

## 2020-01-21 NOTE — Telephone Encounter (Signed)
Spoke with patient regarding results and recommendation.  Patient verbalizes understanding and is agreeable to plan of care. Advised patient to call back with any issues or concerns.  

## 2020-01-21 NOTE — Progress Notes (Signed)
Cardiology Office Note:    Date:  01/22/2020   ID:  Victor Hood, DOB [birth year], MRN [id number]  PCP:  Victor Malta, MD  Cardiologist:  Victor Herrlich, MD    Referring MD: Victor Malta, MD    ASSESSMENT:    1. Coronary artery disease of native artery of native heart with stable angina pectoris (HCC)   2. Chronic systolic heart failure (HCC)   3. Left bundle branch block   4. Hypertensive heart disease with heart failure (HCC)   5. Mixed hyperlipidemia   6. Statin intolerance    PLAN:    In order of problems listed above:  1. He has diffuse CAD not flow-limiting medical therapy. 2. He is improved functionally his current regiment uptitrate and referred to EP for consideration of biventricular pacemaker defibrillator 3. Continue guideline therapy for hypertension increase Entresto and carvedilol 4. We will contact him and advise him start Zetia 10 mg daily. 5. With left bundle branch block severe LV dysfunction may benefit from biventricular pacemaker   Next appointment: 3 months   Medication Adjustments/Labs and Tests Ordered: Current medicines are reviewed at length with the patient today.  Concerns regarding medicines are outlined above.  Orders Placed This Encounter  Procedures  . Ambulatory referral to Cardiac Electrophysiology  . EKG 12-Lead   Meds ordered this encounter  Medications  . carvedilol (COREG) 25 MG tablet    Sig: Take 1 tablet (25 mg total) by mouth 2 (two) times daily.    Dispense:  180 tablet    Refill:  3  . sacubitril-valsartan (ENTRESTO) 97-103 MG    Sig: Take 1 tablet by mouth 2 (two) times daily.    Dispense:  180 tablet    Refill:  3    No chief complaint on file.   History of Present Illness:    Victor Hood is a 64 y.o. male with a hx of chronic systolic heart failure with hypertensive heart disease and hyperlipidemia with statin intolerance.Marland Kitchen  He was last seen 12/24/2019.  Echocardiogram 12/02/2019 showed EF of 30 to  35% and he was transition from ACE inhibitor to [place].  KG shows left bundle branch block QRS duration 154 ms  Compliance with diet, lifestyle and medications: Yes  He underwent cardiac CTA reported on 01/16/2020 with a high calcium score 449 81st percentile for age and sex and had moderate coronary artery disease.  Right coronary artery less than 25% left main less than 25% left anterior descending coronary artery with moderate proximal to mid 50 to 69% stenosis in the same in the first marginal branch left circumflex was free of stenosis.  Fractional flow because of the LAD stenosis which was normal for the left anterior descending coronary artery and recommendation was for maximizing medical therapy.  His wife is present participates in the decision making in the office evaluation.  He has had a nice response to [facility] and feels much better and is having no cardiovascular symptoms of edema shortness of breath chest pain palpitation or syncope.  Reviewed his testing showing severe LV dysfunction multivessel CAD not severe would not benefit from revascularization and left bundle branch block with QRS duration of 156 ms.  He will be referred for EP evaluation to consider CRT/D Will optimize medical therapy increasing carvedilol and Entresto I will plan to see back in 6 weeks.  Both the patient and his wife voiced understanding the results of his test and agree with the plan for EP consultation Past Medical History:  Diagnosis Date  . Chronic left systolic heart failure (HCC) 08/02/2017  . Hyperlipidemia 08/02/2017  . Hypertensive heart disease with heart failure (HCC) 08/02/2017  . Kidney stone 08/02/2017  . Metabolic syndrome 08/02/2017  . Statin intolerance 07/04/2016    Past Surgical History:  Procedure Laterality Date  . BACK SURGERY  2007   decrompressed herniated disc  . FOREARM FRACTURE SURGERY Left 1976  . HUMERUS FRACTURE SURGERY Left 1976    Current Medications: Current  Meds  Medication Sig  . aspirin EC 81 MG tablet Take 81 mg by mouth daily.  . [DISCONTINUED] carvedilol (COREG) 12.5 MG tablet Take 1 tablet (12.5 mg total) by mouth 2 (two) times daily.  . [DISCONTINUED] sacubitril-valsartan (ENTRESTO) 49-51 MG Take 1 tablet by mouth 2 (two) times daily.     Allergies:   Tape   Social History   Socioeconomic History  . Marital status: Single    Spouse name: Not on file  . Number of children: Not on file  . Years of education: Not on file  . Highest education level: Not on file  Occupational History  . Not on file  Tobacco Use  . Smoking status: Never Smoker  . Smokeless tobacco: Never Used  Substance and Sexual Activity  . Alcohol use: Yes    Alcohol/week: 7.0 - 14.0 standard drinks    Types: 7 - 14 Cans of beer per week  . Drug use: Not on file  . Sexual activity: Not on file  Other Topics Concern  . Not on file  Social History Narrative  . Not on file   Social Determinants of Health   Financial Resource Strain: Not on file  Food Insecurity: Not on file  Transportation Needs: Not on file  Physical Activity: Not on file  Stress: Not on file  Social Connections: Not on file     Family History: The patient's family history includes CVA in his mother; Congestive Heart Failure in his father and mother; Diabetes in his mother; Hypercholesterolemia in his maternal grandfather, maternal grandmother, and mother; Pancreatic cancer in his paternal uncle; Prostate cancer in his paternal uncle. ROS:   Please see the history of present illness.    All other systems reviewed and are negative.  EKGs/Labs/Other Studies Reviewed:    The following studies were reviewed today:  EKG:  EKG ordered today and personally reviewed.  The ekg ordered today demonstrates sinus rhythm left bundle branch block QRS duration today 154 ms  Recent Labs: 01/13/2020: BUN 10; Creatinine, Ser 1.12; NT-Pro BNP 396; Potassium 5.1; Sodium 140  Recent Lipid Panel     Component Value Date/Time   CHOL 196 09/12/2017 1034   TRIG 262 (H) 09/12/2017 1034   HDL 32 (L) 09/12/2017 1034   CHOLHDL 6.1 (H) 09/12/2017 1034   LDLCALC 112 (H) 09/12/2017 1034    Physical Exam:    VS:  BP (!) 143/80   Pulse 66   Ht 5' 9.5" (1.765 m)   Wt 194 lb (88 kg)   SpO2 98%   BMI 28.24 kg/m     Wt Readings from Last 3 Encounters:  01/22/20 194 lb (88 kg)  12/24/19 193 lb 3.2 oz (87.6 kg)  11/10/19 190 lb 12.8 oz (86.5 kg)     GEN:  Well nourished, well developed in no acute distress HEENT: Normal NECK: No JVD; No carotid bruits LYMPHATICS: No lymphadenopathy CARDIAC: RRR, no murmurs, rubs, gallops RESPIRATORY:  Clear to auscultation without rales, wheezing or rhonchi  ABDOMEN:  Soft, non-tender, non-distended MUSCULOSKELETAL:  No edema; No deformity  SKIN: Warm and dry NEUROLOGIC:  Alert and oriented x 3 PSYCHIATRIC:  Normal affect    Signed, Victor Herrlich, MD  01/22/2020 9:45 AM    [facility]

## 2020-01-22 ENCOUNTER — Telehealth: Payer: Self-pay | Admitting: Cardiology

## 2020-01-22 ENCOUNTER — Other Ambulatory Visit: Payer: Self-pay

## 2020-01-22 ENCOUNTER — Encounter: Payer: Self-pay | Admitting: Cardiology

## 2020-01-22 ENCOUNTER — Ambulatory Visit (INDEPENDENT_AMBULATORY_CARE_PROVIDER_SITE_OTHER): Payer: PPO | Admitting: Cardiology

## 2020-01-22 VITALS — BP 143/80 | HR 66 | Ht 69.5 in | Wt 194.0 lb

## 2020-01-22 DIAGNOSIS — Z789 Other specified health status: Secondary | ICD-10-CM

## 2020-01-22 DIAGNOSIS — I110 Hypertensive heart disease with heart failure: Secondary | ICD-10-CM

## 2020-01-22 DIAGNOSIS — I5022 Chronic systolic (congestive) heart failure: Secondary | ICD-10-CM

## 2020-01-22 DIAGNOSIS — E782 Mixed hyperlipidemia: Secondary | ICD-10-CM | POA: Diagnosis not present

## 2020-01-22 DIAGNOSIS — I25118 Atherosclerotic heart disease of native coronary artery with other forms of angina pectoris: Secondary | ICD-10-CM

## 2020-01-22 DIAGNOSIS — I447 Left bundle-branch block, unspecified: Secondary | ICD-10-CM | POA: Diagnosis not present

## 2020-01-22 MED ORDER — ENTRESTO 97-103 MG PO TABS: 1.0000 | ORAL_TABLET | Freq: Two times a day (BID) | ORAL | 3 refills | Status: DC

## 2020-01-22 MED ORDER — CARVEDILOL 25 MG PO TABS: 25.0000 mg | ORAL_TABLET | Freq: Two times a day (BID) | ORAL | 3 refills | Status: DC

## 2020-01-22 MED ORDER — EZETIMIBE 10 MG PO TABS: 10.0000 mg | ORAL_TABLET | Freq: Every day | ORAL | 3 refills | Status: DC

## 2020-01-22 NOTE — Patient Instructions (Signed)
Medication Instructions:  Your physician has recommended you make the following change in your medication:  INCREASE: Coreg 25 mg take one tablet by mouth twice daily.  INCREASE: Entresto 97/103 take one tablet by mouth twice daily.  *If you need a refill on your cardiac medications before your next appointment, please call your pharmacy*   Lab Work: None If you have labs (blood work) drawn today and your tests are completely normal, you will receive your results only by: Marland Kitchen MyChart Message (if you have MyChart) OR . A paper copy in the mail If you have any lab test that is abnormal or we need to change your treatment, we will call you to review the results.   Testing/Procedures: None   Follow-Up: At [facility], you and your health needs are our priority.  As part of our continuing mission to provide you with exceptional heart care, we have created designated Provider Care Teams.  These Care Teams include your primary Cardiologist (physician) and Advanced Practice Providers (APPs -  Physician Assistants and Nurse Practitioners) who all work together to provide you with the care you need, when you need it.  We recommend signing up for the patient portal called "MyChart".  Sign up information is provided on this After Visit Summary.  MyChart is used to connect with patients for Virtual Visits (Telemedicine).  Patients are able to view lab/test results, encounter notes, upcoming appointments, etc.  Non-urgent messages can be sent to your provider as well.   To learn more about what you can do with MyChart, go to [email or web address].    Your next appointment:   3 month(s)  The format for your next appointment:   In Person  Provider:   Norman Herrlich, MD   Other Instructions

## 2020-01-22 NOTE — Addendum Note (Signed)
Addended by: Delorse Limber I on: 01/22/2020 09:48 AM   Modules accepted: Orders

## 2020-01-22 NOTE — Telephone Encounter (Signed)
Patient states he is returning a call from today about his medication. He states he did picked up the new prescription.

## 2020-01-22 NOTE — Telephone Encounter (Signed)
Spoke to the patient just now and went over his medication instructions with him. He verbalizes understanding and thanks me for the call back.

## 2020-01-23 ENCOUNTER — Telehealth: Payer: Self-pay | Admitting: Cardiology

## 2020-01-23 NOTE — Telephone Encounter (Signed)
Follow up:     Patient calling stating that some one called him I did not see a note.

## 2020-01-26 ENCOUNTER — Ambulatory Visit: Payer: PPO | Admitting: Cardiology

## 2020-02-02 ENCOUNTER — Other Ambulatory Visit: Payer: Self-pay | Admitting: *Deleted

## 2020-02-02 ENCOUNTER — Telehealth: Payer: Self-pay

## 2020-02-02 ENCOUNTER — Encounter: Payer: Self-pay | Admitting: Cardiology

## 2020-02-02 ENCOUNTER — Ambulatory Visit: Payer: PPO | Admitting: Cardiology

## 2020-02-02 ENCOUNTER — Other Ambulatory Visit: Payer: Self-pay

## 2020-02-02 VITALS — BP 138/72 | HR 56 | Ht 69.5 in | Wt 195.0 lb

## 2020-02-02 DIAGNOSIS — Z01812 Encounter for preprocedural laboratory examination: Secondary | ICD-10-CM | POA: Diagnosis not present

## 2020-02-02 DIAGNOSIS — I5022 Chronic systolic (congestive) heart failure: Secondary | ICD-10-CM

## 2020-02-02 NOTE — Telephone Encounter (Signed)
-----   Message from Baldo Daub, MD sent at 01/28/2020  1:12 PM EST ----- Lets get him in after the first of the year ----- Message ----- From: Felecia Jan, RN Sent: 01/19/2020   4:22 PM EST To: Baldo Daub, MD  He is scheduled for next Monday the 20th. Do you think this is ok or do you want to see him sooner?

## 2020-02-02 NOTE — Telephone Encounter (Signed)
Spoke with patient regarding results and recommendation.  Patient verbalizes understanding and is agreeable to plan of care. Advised patient to call back with any issues or concerns.  

## 2020-02-02 NOTE — Patient Instructions (Signed)
Medication Instructions:  Your physician recommends that you continue on your current medications as directed. Please refer to the Current Medication list given to you today.  *If you need a refill on your cardiac medications before your next appointment, please call your pharmacy*   Lab Work: None ordered If you have labs (blood work) drawn today and your tests are completely normal, you will receive your results only by:  MyChart Message (if you have MyChart) OR  A paper copy in the mail If you have any lab test that is abnormal or we need to change your treatment, we will call you to review the results.   Testing/Procedures: Your physician has requested that you have an echocardiogram in late January. Echocardiography is a painless test that uses sound waves to create images of your heart. It provides your doctor with information about the size and shape of your heart and how well your hearts chambers and valves are working. This procedure takes approximately one hour. There are no restrictions for this procedure.  Your physician has recommended that you have a BiVentricular defibrillator inserted. An implantable cardioverter defibrillator (ICD) is a small device that is placed in your chest or, in rare cases, your abdomen. This device uses electrical pulses or shocks to help control life-threatening, irregular heartbeats that could lead the heart to suddenly stop beating (sudden cardiac arrest). Leads are attached to the ICD that goes into your heart. This is done in the hospital and usually requires an overnight stay. Please see the instructions below located under "other instructions".  CRT or cardiac resynchronization therapy is a treatment used to correct heart failure. When you have heart failure your heart is weakened and doesnt pump as well as it should. This therapy may help reduce symptoms and improve the quality of life.  Please see the handout/brochure given to you today to get  more information of the different options of therapy.   Follow-Up: At [facility], you and your health needs are our priority.  As part of our continuing mission to provide you with exceptional heart care, we have created designated Provider Care Teams.  These Care Teams include your primary Cardiologist (physician) and Advanced Practice Providers (APPs -  Physician Assistants and Nurse Practitioners) who all work together to provide you with the care you need, when you need it.  We recommend signing up for the patient portal called "MyChart".  Sign up information is provided on this After Visit Summary.  MyChart is used to connect with patients for Virtual Visits (Telemedicine).  Patients are able to view lab/test results, encounter notes, upcoming appointments, etc.  Non-urgent messages can be sent to your provider as well.   To learn more about what you can do with MyChart, go to [email or web address].    Your next appointment:   2 week(s) after your defibrillator implant  The format for your next appointment:   In Person  Provider:   device clinic for a wound check    Thank you for choosing [facility]!!   Dory Horn, RN [phone]   Other Instructions   Implantable Device Instructions  You are scheduled for: BiVentricular implantable cardiac defibrillator on 03/16/2020 with Dr. Elberta Fortis.  1.   Pre procedure testing-             A.  LAB WORK--- Between 02/16/20 - 03/12/20.   You do NOT need to be fasting.  You can stop by the [place] office during business hours (avoid lunch time hours)  B. COVID TEST-- On 03/13/2020 @ 11:00 am - This is a Drive Up Visit at [address] [place], [place] [address].  Someone will direct you to the appropriate testing line. Stay in your car and someone will be with you shortly.   After you are tested please go home and self quarantine until the day of your procedure.    2. On the day of your procedure 03/16/2020 you  will go to [facility] [phone]) at 11:30 am.  Bonita Quin will go to the main entrance [employer]) and enter where the [employer] are.  You will check in at ADMITTING.  You may have one support person come in to the hospital with you.  They will be asked to wait in the waiting room.   3.   Do not eat or drink after midnight prior to your procedure.   4.   On the morning of your procedure do NOT take any medication.   5.  Plan for an overnight stay.  If you use your phone frequently bring your phone charger.  When you are discharged you will need someone to drive you home.   6.  You will follow up with the [facility] clinic 10-14 days after your procedure. You will follow up with Dr. Elberta Fortis 91 days after your procedure.  These appointments will be made for you.   * If you have ANY questions after you get home, please call the office [phone] and ask for Keelynn Furgerson RN or send a MyChart message.    Cardioverter Defibrillator Implantation  An implantable cardioverter defibrillator (ICD) is a small device that is placed under the skin in the chest or abdomen. An ICD consists of a battery, a small computer (pulse generator), and wires (leads) that go into the heart. An ICD is used to detect and correct two types of dangerous irregular heartbeats (arrhythmias):  A rapid heart rhythm (tachycardia).  An arrhythmia in which the lower chambers of the heart (ventricles) contract in an uncoordinated way (fibrillation). When an ICD detects tachycardia, it sends a low-energy shock to the heart to restore the heartbeat to normal (cardioversion). This signal is usually painless. If cardioversion does not work or if the ICD detects fibrillation, it delivers a high-energy shock to the heart (defibrillation) to restart the heart. This shock may feel like a strong jolt in the chest. Your health care provider may prescribe an ICD if:  You have had an arrhythmia that originated in  the ventricles.  Your heart has been damaged by a disease or heart condition. Sometimes, ICDs are programmed to act as a device called a pacemaker. Pacemakers can be used to treat a slow heartbeat (bradycardia) or tachycardia by taking over the heart rate with electrical impulses. Tell a health care provider about:  Any allergies you have.  All medicines you are taking, including vitamins, herbs, eye drops, creams, and over-the-counter medicines.  Any problems you or family members have had with anesthetic medicines.  Any blood disorders you have.  Any surgeries you have had.  Any medical conditions you have.  Whether you are pregnant or may be pregnant. What are the risks? Generally, this is a safe procedure. However, problems may occur, including:  Swelling, bleeding, or bruising.  Infection.  Blood clots.  Damage to other structures or organs, such as nerves, blood vessels, or the heart.  Allergic reactions to medicines used during the procedure. What happens before the  procedure? Staying hydrated Follow instructions from your health care provider about hydration, which may include:  Up to 2 hours before the procedure - you may continue to drink clear liquids, such as water, clear fruit juice, black coffee, and plain tea. Eating and drinking restrictions Follow instructions from your health care provider about eating and drinking, which may include:  8 hours before the procedure - stop eating heavy meals or foods such as meat, fried foods, or fatty foods.  6 hours before the procedure - stop eating light meals or foods, such as toast or cereal.  6 hours before the procedure - stop drinking milk or drinks that contain milk.  2 hours before the procedure - stop drinking clear liquids. Medicine Ask your health care provider about:  Changing or stopping your normal medicines. This is important if you take diabetes medicines or blood thinners.  Taking medicines such  as aspirin and ibuprofen. These medicines can thin your blood. Do not take these medicines before your procedure if your doctor tells you not to. Tests  You may have blood tests.  You may have a test to check the electrical signals in your heart (electrocardiogram, ECG).  You may have imaging tests, such as a chest X-ray. General instructions  For 24 hours before the procedure, stop using products that contain nicotine or tobacco, such as cigarettes and e-cigarettes. If you need help quitting, ask your health care provider.  Plan to have someone take you home from the hospital or clinic.  You may be asked to shower with a germ-killing soap. What happens during the procedure?  To reduce your risk of infection: ? Your health care team will wash or sanitize their hands. ? Your skin will be washed with soap. ? Hair may be removed from the surgical area.  Small monitors will be put on your body. They will be used to check your heart, blood pressure, and oxygen level.  An IV tube will be inserted into one of your veins.  You will be given one or more of the following: ? A medicine to help you relax (sedative). ? A medicine to numb the area (local anesthetic). ? A medicine to make you fall asleep (general anesthetic).  Leads will be guided through a blood vessel into your heart and attached to your heart muscles. Depending on the ICD, the leads may go into one ventricle or they may go into both ventricles and into an upper chamber of the heart. An X-ray machine (fluoroscope) will be usedto help guide the leads.  A small incision will be made to create a deep pocket under your skin.  The pulse generator will be placed into the pocket.  The ICD will be tested.  The incision will be closed with stitches (sutures), skin glue, or staples.  A bandage (dressing) will be placed over the incision. This procedure may vary among health care providers and hospitals. What happens after the  procedure?  Your blood pressure, heart rate, breathing rate, and blood oxygen level will be monitored often until the medicines you were given have worn off.  A chest X-ray will be taken to check that the ICD is in the right place.  You will need to stay in the hospital for 1-2 days so your health care provider can make sure your ICD is working.  Do not drive for 24 hours if you received a sedative. Ask your health care provider when it is safe for you to drive.  You  may be given an identification card explaining that you have an ICD. Summary  An implantable cardioverter defibrillator (ICD) is a small device that is placed under the skin in the chest or abdomen. It is used to detect and correct dangerous irregular heartbeats (arrhythmias).  An ICD consists of a battery, a small computer (pulse generator), and wires (leads) that go into the heart.  When an ICD detects rapid heart rhythm (tachycardia), it sends a low-energy shock to the heart to restore the heartbeat to normal (cardioversion). If cardioversion does not work or if the ICD detects uncoordinated heart contractions (fibrillation), it delivers a high-energy shock to the heart (defibrillation) to restart the heart.  You will need to stay in the hospital for 1-2 days to make sure your ICD is working. This information is not intended to replace advice given to you by your health care provider. Make sure you discuss any questions you have with your health care provider. Document Revised: 01/05/2017 Document Reviewed: 02/02/2016 Elsevier Patient Education  2020 [employer].

## 2020-02-02 NOTE — Progress Notes (Signed)
Electrophysiology Office Note   Date:  02/02/2020   ID:  Victor Hood, DOB 1955/12/05, MRN [id number]  PCP:  Buckner Malta, Victor Hood  Cardiologist:  Victor Hood Primary Electrophysiologist:  Victor Pannone [name], Victor Hood    Chief Complaint: CHF   History of Present Illness: Victor Hood is a 64 y.o. male who is being seen today for the evaluation of CHF at the request of Victor Hood, Victor Oven, Victor Hood. Presenting today for electrophysiology evaluation.  He has a history of chronic systolic heart failure, hypertension, hyperlipidemia.  He had an echo 12/02/2019 that showed an ejection fraction of 30 to 35%.  He was started on Entresto at that time.  ECG showed a left bundle branch block with a QRS duration of 154 ms.  Coronary CTA 01/16/2020 showed a high calcium score and moderate coronary artery disease.  FFR was negative.  After starting Sherryll Burger, he felt improved with no cardiovascular symptoms of shortness of breath, chest pain.  Today, he denies symptoms of palpitations, chest pain, shortness of breath, orthopnea, PND, lower extremity edema, claudication, dizziness, presyncope, syncope, bleeding, or neurologic sequela. The patient is tolerating medications without difficulties.    Past Medical History:  Diagnosis Date  . Chronic left systolic heart failure (HCC) 08/02/2017  . Hyperlipidemia 08/02/2017  . Hypertensive heart disease with heart failure (HCC) 08/02/2017  . Kidney stone 08/02/2017  . Metabolic syndrome 08/02/2017  . Statin intolerance 07/04/2016   Past Surgical History:  Procedure Laterality Date  . BACK SURGERY  2007   decrompressed herniated disc  . FOREARM FRACTURE SURGERY Left 1976  . HUMERUS FRACTURE SURGERY Left 1976     Current Outpatient Medications  Medication Sig Dispense Refill  . aspirin EC 81 MG tablet Take 81 mg by mouth daily.    . carvedilol (COREG) 25 MG tablet Take 1 tablet (25 mg total) by mouth 2 (two) times daily. 180 tablet 3  . ezetimibe (ZETIA) 10 MG tablet  Take 1 tablet (10 mg total) by mouth daily. 90 tablet 3  . sacubitril-valsartan (ENTRESTO) 97-103 MG Take 1 tablet by mouth 2 (two) times daily. 180 tablet 3   No current facility-administered medications for this visit.    Allergies:   Tape   Social History:  The patient  reports that he has never smoked. He has never used smokeless tobacco. He reports current alcohol use of about 7.0 - 14.0 standard drinks of alcohol per week.   Family History:  The patient's family history includes CVA in his mother; Congestive Heart Failure in his father and mother; Diabetes in his mother; Hypercholesterolemia in his maternal grandfather, maternal grandmother, and mother; Pancreatic cancer in his paternal uncle; Prostate cancer in his paternal uncle.    ROS:  Please see the history of present illness.   Otherwise, review of systems is positive for none.   All other systems are reviewed and negative.    PHYSICAL EXAM: VS:  BP 138/72   Pulse (!) 56   Ht 5' 9.5" (1.765 m)   Wt 195 lb (88.5 kg)   SpO2 97%   BMI 28.38 kg/m  , BMI Body mass index is 28.38 kg/m. GEN: Well nourished, well developed, in no acute distress  HEENT: normal  Neck: no JVD, carotid bruits, or masses Cardiac: RRR; no murmurs, rubs, or gallops,no edema  Respiratory:  clear to auscultation bilaterally, normal work of breathing GI: soft, nontender, nondistended, + BS MS: no deformity or atrophy  Skin: warm and dry Neuro:  Strength and sensation  are intact Psych: euthymic mood, full affect  EKG:  EKG is not ordered today. Personal review of the ekg ordered 01/22/20 shows sinus rhythm, left bundle branch block  Recent Labs: 01/13/2020: BUN 10; Creatinine, Ser 1.12; NT-Pro BNP 396; Potassium 5.1; Sodium 140    Lipid Panel     Component Value Date/Time   CHOL 196 09/12/2017 1034   TRIG 262 (H) 09/12/2017 1034   HDL 32 (L) 09/12/2017 1034   CHOLHDL 6.1 (H) 09/12/2017 1034   LDLCALC 112 (H) 09/12/2017 1034     Wt  Readings from Last 3 Encounters:  02/02/20 195 lb (88.5 kg)  01/22/20 194 lb (88 kg)  12/24/19 193 lb 3.2 oz (87.6 kg)      Other studies Reviewed: Additional studies/ records that were reviewed today include: TTE 12/02/19  Review of the above records today demonstrates:  1. Left ventricular ejection fraction, by estimation, is 30 to 35%. The  left ventricle has moderately decreased function. The left ventricle has  no regional wall motion abnormalities. There is mild left ventricular  hypertrophy. Left ventricular  diastolic parameters are consistent with Grade I diastolic dysfunction  (impaired relaxation).  2. Right ventricular systolic function is normal. The right ventricular  size is normal. There is normal pulmonary artery systolic pressure.  3. Left atrial size was mildly dilated.  4. The mitral valve is normal in structure. Mild mitral valve  regurgitation. No evidence of mitral stenosis.  5. The aortic valve is normal in structure. Aortic valve regurgitation is  not visualized. No aortic stenosis is present.  6. The inferior vena cava is normal in size with greater than 50%  respiratory variability, suggesting right atrial pressure of 3 mmHg.   Coronary CTA 01/16/2020 1. Moderate Coronary artery disease. CADRADS 3. This study Darolyn Double be send for FFRct.  2. Coronary calcium score of 449. This was 59 percentile for age and sex matched control.  3. Normal coronary origin with right dominance.  1. Left Main: 0.97  2. LAD: Proximal 0.90  Mid 0.80 distal 0.79  3. LCX: Proximal 0.70  Mid 0.60 distal not analyzed  4. RCA: Proximal 0.86 Mid 0.79  Distal 0.76   ASSESSMENT AND PLAN:  1.  Chronic systolic heart failure: Currently on optimal medical therapy with Entresto and carvedilol.  He had an echo that showed an ejection fraction of 30 to 35%.  He also has a left bundle branch block.  He would likely benefit from CRT pacing.  He did have an improvement in his  symptomatology after starting his Entresto.  Due to that, we Ellason Segar plan for echocardiogram 3 months after Entresto start.  If his ejection fraction remains low at that point, we Orman Matsumura plan for ICD implant.  2.  Hypertension with hypertensive heart disease: Blood pressure mildly elevated today.  Usually better controlled.  No changes.  3.  Coronary artery disease: No flow-limiting disease noted on coronary CT.  Continue plan per primary cardiology.  Case discussed with primary cardiology  Current medicines are reviewed at length with the patient today.   The patient does not have concerns regarding his medicines.  The following changes were made today:  none  Labs/ tests ordered today include:  Orders Placed This Encounter  Procedures  . Basic metabolic panel  . CBC  . ECHOCARDIOGRAM COMPLETE     Disposition:   FU with Marquel Spoto 3 months  Signed, Savita Runner [name], Victor Hood  02/02/2020 3:56 PM     [facility]  [address] [place] [address] [phone] (office) [phone] (fax)

## 2020-02-24 ENCOUNTER — Telehealth: Payer: Self-pay | Admitting: *Deleted

## 2020-02-24 NOTE — Telephone Encounter (Signed)
Pt advised that CRTD implant scheduled for 2/8 will need to be rescheduled to later date, May/April. Pt would like me to call back at later date to reschedule. Pt will keep 3/7 appt w/ Camnitz in preparation for rescheduling. Patient verbalized understanding and agreeable to plan.

## 2020-03-03 ENCOUNTER — Ambulatory Visit (INDEPENDENT_AMBULATORY_CARE_PROVIDER_SITE_OTHER): Payer: PPO

## 2020-03-03 ENCOUNTER — Other Ambulatory Visit: Payer: Self-pay

## 2020-03-03 DIAGNOSIS — I5022 Chronic systolic (congestive) heart failure: Secondary | ICD-10-CM

## 2020-03-03 LAB — ECHOCARDIOGRAM COMPLETE
Area-P 1/2: 3.72 cm2
Calc EF: 33.4 %
S' Lateral: 4.1 cm
Single Plane A2C EF: 30.3 %
Single Plane A4C EF: 35.9 %

## 2020-03-03 NOTE — Progress Notes (Signed)
Complete echocardiogram performed.  Victor Hood

## 2020-03-08 DIAGNOSIS — Z6827 Body mass index (BMI) 27.0-27.9, adult: Secondary | ICD-10-CM | POA: Diagnosis not present

## 2020-03-08 DIAGNOSIS — I110 Hypertensive heart disease with heart failure: Secondary | ICD-10-CM | POA: Diagnosis not present

## 2020-03-08 DIAGNOSIS — I5022 Chronic systolic (congestive) heart failure: Secondary | ICD-10-CM | POA: Diagnosis not present

## 2020-03-13 ENCOUNTER — Other Ambulatory Visit (HOSPITAL_COMMUNITY): Payer: PPO

## 2020-03-16 ENCOUNTER — Telehealth: Payer: Self-pay | Admitting: Cardiology

## 2020-03-16 NOTE — Telephone Encounter (Signed)
Pt aware of March appt to discuss echo findings ./cy

## 2020-03-16 NOTE — Telephone Encounter (Signed)
Patient is requesting to discuss his echo results.

## 2020-03-16 NOTE — Telephone Encounter (Signed)
Pt scheduled to see Camnitz 3/7 to reschedule CRTD implant.    Will [name], MD  03/04/2020 11:08 AM EST      EF remains low. Would recommend biventricular ICD.

## 2020-04-11 NOTE — Progress Notes (Signed)
Electrophysiology Office Note   Date:  04/12/2020   ID:  Victor Hood, DOB 05-Sep-1955, MRN [id number]  PCP:  Buckner Malta, MD  Cardiologist:  Dulce Sellar Primary Electrophysiologist:  Kesa Birky [name], MD    Chief Complaint: CHF   History of Present Illness: Victor Hood is a 65 y.o. male who is being seen today for the evaluation of CHF at the request of Buckner Malta, MD. Presenting today for electrophysiology evaluation.  He has a history significant for chronic systolic heart failure, hypertension, hyperlipidemia.  Echo 03/04/2019 showed an ejection fraction of 30 to 35%.  Repeat echo 03/03/2020 showed a persistently low ejection fraction.  Coronary CT showed no flow-limiting stenosis.  He has a left bundle branch block.  He was initially planned for CRT-D implant, though he wanted to wait until a repeat echo was done.  Today, denies symptoms of palpitations, chest pain, shortness of breath, orthopnea, PND, lower extremity edema, claudication, dizziness, presyncope, syncope, bleeding, or neurologic sequela. The patient is tolerating medications without difficulties.  Since being on his heart failure medications, he has felt well.  He has no chest pain or shortness of his daily activities.  That his shortness of breath and fatigue has improved on Entresto and carvedilol.   Past Medical History:  Diagnosis Date   Chronic left systolic heart failure (HCC) 08/02/2017   Hyperlipidemia 08/02/2017   Hypertensive heart disease with heart failure (HCC) 08/02/2017   Kidney stone 08/02/2017   Metabolic syndrome 08/02/2017   Statin intolerance 07/04/2016   Past Surgical History:  Procedure Laterality Date   BACK SURGERY  2007   decrompressed herniated disc   FOREARM FRACTURE SURGERY Left 1976   HUMERUS FRACTURE SURGERY Left 1976     Current Outpatient Medications  Medication Sig Dispense Refill   aspirin EC 81 MG tablet Take 81 mg by mouth daily.     carvedilol (COREG)  25 MG tablet Take 1 tablet (25 mg total) by mouth 2 (two) times daily. 180 tablet 3   ezetimibe (ZETIA) 10 MG tablet Take 1 tablet (10 mg total) by mouth daily. 90 tablet 3   sacubitril-valsartan (ENTRESTO) 97-103 MG Take 1 tablet by mouth 2 (two) times daily. 180 tablet 3   No current facility-administered medications for this visit.    Allergies:   Tape   Social History:  The patient  reports that he has never smoked. He has never used smokeless tobacco. He reports current alcohol use of about 7.0 - 14.0 standard drinks of alcohol per week.   Family History:  The patient's family history includes CVA in his mother; Congestive Heart Failure in his father and mother; Diabetes in his mother; Hypercholesterolemia in his maternal grandfather, maternal grandmother, and mother; Pancreatic cancer in his paternal uncle; Prostate cancer in his paternal uncle.   ROS:  Please see the history of present illness.   Otherwise, review of systems is positive for none.   All other systems are reviewed and negative.   PHYSICAL EXAM: VS:  BP 126/70    Pulse (!) 50    Ht 5' 9.5" (1.765 m)    Wt 200 lb (90.7 kg)    SpO2 97%    BMI 29.11 kg/m  , BMI Body mass index is 29.11 kg/m. GEN: Well nourished, well developed, in no acute distress  HEENT: normal  Neck: no JVD, carotid bruits, or masses Cardiac: RRR; no murmurs, rubs, or gallops,no edema  Respiratory:  clear to auscultation bilaterally, normal work of breathing GI:  soft, nontender, nondistended, + BS MS: no deformity or atrophy  Skin: warm and dry Neuro:  Strength and sensation are intact Psych: euthymic mood, full affect  EKG:  EKG is ordered today. Personal review of the ekg ordered shows sinus rhythm, rate 58, voltage anterior for LVH  Recent Labs: 01/13/2020: BUN 10; Creatinine, Ser 1.12; NT-Pro BNP 396; Potassium 5.1; Sodium 140    Lipid Panel     Component Value Date/Time   CHOL 196 09/12/2017 1034   TRIG 262 (H) 09/12/2017 1034    HDL 32 (L) 09/12/2017 1034   CHOLHDL 6.1 (H) 09/12/2017 1034   LDLCALC 112 (H) 09/12/2017 1034     Wt Readings from Last 3 Encounters:  04/12/20 200 lb (90.7 kg)  02/02/20 195 lb (88.5 kg)  01/22/20 194 lb (88 kg)      Other studies Reviewed: Additional studies/ records that were reviewed today include: TTE 03/03/2020 Review of the above records today demonstrates:  1. Left ventricular ejection fraction, by estimation, is 30 to 35%. The  left ventricle has moderately decreased function. The left ventricle has  no regional wall motion abnormalities. There is moderate left ventricular  hypertrophy. Left ventricular  diastolic parameters are consistent with Grade I diastolic dysfunction  (impaired relaxation).  2. Right ventricular systolic function is normal. The right ventricular  size is normal. There is normal pulmonary artery systolic pressure.  3. The mitral valve is normal in structure. Mild mitral valve  regurgitation. No evidence of mitral stenosis.  4. The aortic valve is normal in structure. Aortic valve regurgitation is  not visualized. No aortic stenosis is present.  5. The inferior vena cava is normal in size with greater than 50%  respiratory variability, suggesting right atrial pressure of 3 mmHg.   Coronary CTA 01/16/2020 1. Moderate Coronary artery disease. CADRADS 3. This study Tyerra Loretto be send for FFRct.  2. Coronary calcium score of 449. This was 6 percentile for age and sex matched control.  3. Normal coronary origin with right dominance.  1. Left Main: 0.97  2. LAD: Proximal 0.90  Mid 0.80 distal 0.79  3. LCX: Proximal 0.70  Mid 0.60 distal not analyzed  4. RCA: Proximal 0.86 Mid 0.79  Distal 0.76   ASSESSMENT AND PLAN:  1.  Chronic systolic heart failure: Currently on her medical therapy with Entresto and carvedilol.  Echo shows an ejection fraction of 30 to 35%.  He has a left bundle branch block.  He would benefit from CRT-D implant.   Risks and benefits of been discussed which were bleeding, tamponade, infection, pneumothorax.  The patient understands these risks and is agreed to the procedure.    2.  Hypertension with hypertensive heart disease: Currently well controlled  3.  Coronary artery disease: No flow-limiting disease on coronary CT.  Plan per primary cardiology.     Current medicines are reviewed at length with the patient today.   The patient does not have concerns regarding his medicines.  The following changes were made today:  none  Labs/ tests ordered today include:  Orders Placed This Encounter  Procedures   Basic metabolic panel   CBC   EKG 12-Lead     Disposition:   FU with Violetta Lavalle 3 months  Signed, Rakin Lemelle [name], MD  04/12/2020 12:17 PM     [facility] [address] [place] [address] [phone] (office) [phone] (fax)

## 2020-04-12 ENCOUNTER — Encounter: Payer: Self-pay | Admitting: Cardiology

## 2020-04-12 ENCOUNTER — Other Ambulatory Visit: Payer: Self-pay

## 2020-04-12 ENCOUNTER — Ambulatory Visit: Payer: PPO | Admitting: Cardiology

## 2020-04-12 VITALS — BP 126/70 | HR 50 | Ht 69.5 in | Wt 200.0 lb

## 2020-04-12 DIAGNOSIS — Z01812 Encounter for preprocedural laboratory examination: Secondary | ICD-10-CM

## 2020-04-12 DIAGNOSIS — I5022 Chronic systolic (congestive) heart failure: Secondary | ICD-10-CM

## 2020-04-12 NOTE — Patient Instructions (Signed)
Medication Instructions:  Your physician recommends that you continue on your current medications as directed. Please refer to the Current Medication list given to you today.  *If you need a refill on your cardiac medications before your next appointment, please call your pharmacy*   Lab Work: None ordered If you have labs (blood work) drawn today and your tests are completely normal, you will receive your results only by: Marland Kitchen MyChart Message (if you have MyChart) OR . A paper copy in the mail If you have any lab test that is abnormal or we need to change your treatment, we will call you to review the results.   Testing/Procedures: Your physician has recommended that you have a defibrillator inserted. An implantable cardioverter defibrillator (ICD) is a small device that is placed in your chest or, in rare cases, your abdomen. This device uses electrical pulses or shocks to help control life-threatening, irregular heartbeats that could lead the heart to suddenly stop beating (sudden cardiac arrest). Leads are attached to the ICD that goes into your heart. This is done in the hospital and usually requires an overnight stay. Please see the instructions below located under "other instructions".   Follow-Up: At [facility], you and your health needs are our priority.  As part of our continuing mission to provide you with exceptional heart care, we have created designated Provider Care Teams.  These Care Teams include your primary Cardiologist (physician) and Advanced Practice Providers (APPs -  Physician Assistants and Nurse Practitioners) who all work together to provide you with the care you need, when you need it.  We recommend signing up for the patient portal called "MyChart".  Sign up information is provided on this After Visit Summary.  MyChart is used to connect with patients for Virtual Visits (Telemedicine).  Patients are able to view lab/test results, encounter notes, upcoming  appointments, etc.  Non-urgent messages can be sent to your provider as well.   To learn more about what you can do with MyChart, go to [email or web address].    Your next appointment:   2 week(s) after your ICD implant  The format for your next appointment:   In Person  Provider:   device clinic for a wound check    Thank you for choosing [facility]!!   Dory Horn, RN [phone]   Other Instructions    Implantable Device Instructions  You are scheduled for: Implantable cardiac defibrillator on 07/01/2020 with Dr. Elberta Fortis.  1.   Pre procedure testing-             A.  LAB WORK--- between 06/07/2020 - 06/25/2020   for your pre procedure blood work.  You do NOT need to be fasting. You can stop by the [place] office (avoid lunch time hours)              B. COVID TEST-- On 06/29/2020 @ 11:00 am  - This is a Drive Up Visit at [address] [place], [place] [address].  Someone will direct you to the appropriate testing line. Stay in your car and someone will be with you shortly.   After you are tested please go home and self quarantine until the day of your procedure.    2. On the day of your procedure 07/01/2020 you will go to [facility] [phone]) at 8:30 am.  Bonita Quin will go to the main entrance [employer]) and enter where the [employer] are.  You will check in  at ADMITTING.  You may have one support person come in to the hospital with you.  They will be asked to wait in the waiting room.   3.   Do not eat or drink after midnight prior to your procedure.   4.   On the morning of your procedure do NOT take any medication.  5.  The night before your procedure and the morning of your procedure scrub your neck/chest with surgical scrub.  See instruction letter below.   5.  Plan for an overnight stay, but you may be discharged home after your procedure. If you use your phone frequently bring your phone charger, in case you have to stay.  If you  are discharged after your procedure you will need someone to drive you home and be with your for 24 hours after your procedure.   6.  You will follow up with the [facility] Device clinic 10-14 days after your procedure. You will follow up with Dr. Elberta Fortis 91 days after your procedure.  These appointments will be made for you.   * If you have ANY questions after you get home, please call the office [phone] and ask for Essam Lowdermilk RN or send a MyChart message.   [place] - Preparing For Surgery (surgery scrub)  Before surgery, you can play an important role. Because skin is not sterile, your skin needs to be as free of germs as possible. You can reduce the number of germs on your skin by washing with CHG (chlorahexidine gluconate) Soap before surgery.  CHG is an antiseptic cleaner which kills germs and bonds with the skin to continue killing germs even after washing.   Please do not use if you have an allergy to CHG or antibacterial soaps.  If your skin becomes reddened/irritated stop using the CHG.   Do not shave (including legs and underarms) for at least 48 hours prior to first CHG shower.  It is OK to shave your face.  Please follow these instructions carefully:  1.  Shower the night before surgery and the morning of surgery with CHG.  2.  If you choose to wash your hair, wash your hair first as usual with your normal shampoo.  3.  After you shampoo, rinse your hair and body thoroughly to remove the shampoo.  4.  Use CHG as you would any other liquid soap.  You can apply CHG directly to the skin and wash gently with a clean washcloth. 5.  Apply the CHG Soap to your body ONLY FROM THE NECK DOWN.  Do not use on open wounds or open sores.  Avoid contact with your eyes, ears, mouth and genitals (private parts).  Wash genitals (private parts) with your normal soap.  6.  Wash thoroughly, paying special attention to the area where your surgery will be performed.  7.  Thoroughly rinse your body  with warm water from the neck down.   8.  DO NOT shower/wash with your normal soap after using and rinsing off the CHG soap.  9.  Pat yourself dry with a clean towel.           10.  Wear clean pajamas.           11.  Place clean sheets on your bed the night of your first shower and do not sleep with pets.  Day of Surgery: Do not apply any deodorants/lotions.  Please wear clean clothes to the hospital/surgery center.

## 2020-04-22 NOTE — Progress Notes (Unsigned)
Cardiology Office Note:    Date:  04/23/2020   ID:  Victor Hood, DOB [birth year], MRN [id number]  PCP:  Buckner Malta, MD  Cardiologist:  Norman Herrlich, MD    Referring MD: Buckner Malta, MD    ASSESSMENT:    1. Chronic systolic (congestive) heart failure (HCC)   2. Hypertensive heart disease with heart failure (HCC)   3. Left bundle branch block   4. Mild CAD   5. Mixed hyperlipidemia    PLAN:    In order of problems listed above:  1. Presently New York Heart Association class I not on a loop diuretic continue his beta-blocker Entresto add-on MRA SGLT2 inhibitor check renal function 2 weeks no trend blood pressures at home. 2. Stable mild CAD continue medical therapy takes aspirin statin and Zetia. 3. If EF remains severely reduced at strongly encouraged him to accept CRT-D.  He is reluctant   Next appointment: 3 months we will plan echocardiogram at that time   Medication Adjustments/Labs and Tests Ordered: Current medicines are reviewed at length with the patient today.  Concerns regarding medicines are outlined above.  Orders Placed This Encounter  Procedures  . Basic metabolic panel  . Pro b natriuretic peptide (BNP)   Meds ordered this encounter  Medications  . eplerenone (INSPRA) 25 MG tablet    Sig: Take 1 tablet (25 mg total) by mouth daily.    Dispense:  90 tablet    Refill:  3  . dapagliflozin propanediol (FARXIGA) 10 MG TABS tablet    Sig: Take 1 tablet (10 mg total) by mouth daily before breakfast.    Dispense:  90 tablet    Refill:  3    Chief Complaint  Patient presents with  . Follow-up  . Congestive Heart Failure    History of Present Illness:    Victor Hood is a 65 y.o. male with a hx of chronic systolic heart failure hypertensive heart disease hyperlipidemia with statin intolerance ejection fraction 30 to 35% in the setting of left bundle branch block and a QRS duration of 104 ms last seen 01/22/2020.He underwent cardiac CTA  reported on 01/16/2020 with a high calcium score 449 81st percentile for age and sex and had moderate coronary artery disease.  Right coronary artery less than 25% left main less than 25% left anterior descending coronary artery with moderate proximal to mid 50 to 69% stenosis in the same in the first marginal branch left circumflex was free of stenosis.   Compliance with diet, lifestyle and medications: Yes  Symptomatically he is doing well he is New York Heart Association class I has no exercise intolerance dyspnea chest pain palpitation or syncope and is compliant with his cardiac meds without side effects. He is closing on a new poultry business and he is worried about using equipment like a chainsaw if he has an ICD. He is taking target doses of carvedilol and Entresto and will add on SGLT2 inhibitor MRA rechecking renal function 2 weeks and at his next office visit plan echocardiogram.  If EF remains severely reduced he benefit from CRT therapy.  He is reluctant. Past Medical History:  Diagnosis Date  . Chronic left systolic heart failure (HCC) 08/02/2017  . Hyperlipidemia 08/02/2017  . Hypertensive heart disease with heart failure (HCC) 08/02/2017  . Kidney stone 08/02/2017  . Metabolic syndrome 08/02/2017  . Statin intolerance 07/04/2016    Past Surgical History:  Procedure Laterality Date  . BACK SURGERY  2007   decrompressed herniated  disc  . FOREARM FRACTURE SURGERY Left 1976  . HUMERUS FRACTURE SURGERY Left 1976    Current Medications: Current Meds  Medication Sig  . aspirin EC 81 MG tablet Take 81 mg by mouth daily.  . carvedilol (COREG) 25 MG tablet Take 1 tablet (25 mg total) by mouth 2 (two) times daily.  . dapagliflozin propanediol (FARXIGA) 10 MG TABS tablet Take 1 tablet (10 mg total) by mouth daily before breakfast.  . eplerenone (INSPRA) 25 MG tablet Take 1 tablet (25 mg total) by mouth daily.  Marland Kitchen ezetimibe (ZETIA) 10 MG tablet Take 1 tablet (10 mg total) by mouth  daily.  . pravastatin (PRAVACHOL) 80 MG tablet Take 80 mg by mouth 2 (two) times a week.  . sacubitril-valsartan (ENTRESTO) 97-103 MG Take 1 tablet by mouth 2 (two) times daily.     Allergies:   Tape   Social History   Socioeconomic History  . Marital status: Single    Spouse name: Not on file  . Number of children: Not on file  . Years of education: Not on file  . Highest education level: Not on file  Occupational History  . Not on file  Tobacco Use  . Smoking status: Never Smoker  . Smokeless tobacco: Never Used  Substance and Sexual Activity  . Alcohol use: Yes    Alcohol/week: 7.0 - 14.0 standard drinks    Types: 7 - 14 Cans of beer per week  . Drug use: Not on file  . Sexual activity: Not on file  Other Topics Concern  . Not on file  Social History Narrative  . Not on file   Social Determinants of Health   Financial Resource Strain: Not on file  Food Insecurity: Not on file  Transportation Needs: Not on file  Physical Activity: Not on file  Stress: Not on file  Social Connections: Not on file     Family History: The patient's family history includes CVA in his mother; Congestive Heart Failure in his father and mother; Diabetes in his mother; Hypercholesterolemia in his maternal grandfather, maternal grandmother, and mother; Pancreatic cancer in his paternal uncle; Prostate cancer in his paternal uncle. ROS:   Please see the history of present illness.    All other systems reviewed and are negative.  EKGs/Labs/Other Studies Reviewed:    The following studies were reviewed today:    Recent Labs: 01/13/2020: BUN 10; Creatinine, Ser 1.12; NT-Pro BNP 396; Potassium 5.1; Sodium 140  Recent Lipid Panel    Component Value Date/Time   CHOL 196 09/12/2017 1034   TRIG 262 (H) 09/12/2017 1034   HDL 32 (L) 09/12/2017 1034   CHOLHDL 6.1 (H) 09/12/2017 1034   LDLCALC 112 (H) 09/12/2017 1034    Physical Exam:    VS:  BP 130/70 (BP Location: Right Arm)   Pulse  (!) 58   Ht 5\' 10"  (1.778 m)   Wt 195 lb (88.5 kg)   SpO2 94%   BMI 27.98 kg/m     Wt Readings from Last 3 Encounters:  04/23/20 195 lb (88.5 kg)  04/12/20 200 lb (90.7 kg)  02/02/20 195 lb (88.5 kg)     GEN:  Well nourished, well developed in no acute distress HEENT: Normal NECK: No JVD; No carotid bruits LYMPHATICS: No lymphadenopathy CARDIAC: S2 paradoxical RRR, no murmurs, rubs, gallops RESPIRATORY:  Clear to auscultation without rales, wheezing or rhonchi  ABDOMEN: Soft, non-tender, non-distended MUSCULOSKELETAL:  No edema; No deformity  SKIN: Warm and dry NEUROLOGIC:  Alert and oriented x 3 PSYCHIATRIC:  Normal affect    Signed, Norman Herrlich, MD  04/23/2020 9:47 AM    [facility]

## 2020-04-23 ENCOUNTER — Telehealth: Payer: Self-pay | Admitting: Cardiology

## 2020-04-23 ENCOUNTER — Other Ambulatory Visit: Payer: Self-pay

## 2020-04-23 ENCOUNTER — Ambulatory Visit: Payer: PPO | Admitting: Cardiology

## 2020-04-23 ENCOUNTER — Encounter: Payer: Self-pay | Admitting: Cardiology

## 2020-04-23 ENCOUNTER — Other Ambulatory Visit: Payer: Self-pay | Admitting: Cardiology

## 2020-04-23 VITALS — BP 130/70 | HR 58 | Ht 70.0 in | Wt 195.0 lb

## 2020-04-23 DIAGNOSIS — E782 Mixed hyperlipidemia: Secondary | ICD-10-CM | POA: Diagnosis not present

## 2020-04-23 DIAGNOSIS — I5022 Chronic systolic (congestive) heart failure: Secondary | ICD-10-CM

## 2020-04-23 DIAGNOSIS — I447 Left bundle-branch block, unspecified: Secondary | ICD-10-CM

## 2020-04-23 DIAGNOSIS — I110 Hypertensive heart disease with heart failure: Secondary | ICD-10-CM | POA: Diagnosis not present

## 2020-04-23 DIAGNOSIS — I2510 Atherosclerotic heart disease of native coronary artery without angina pectoris: Secondary | ICD-10-CM

## 2020-04-23 MED ORDER — EPLERENONE 25 MG PO TABS: 25.0000 mg | ORAL_TABLET | Freq: Every day | ORAL | 3 refills | Status: DC

## 2020-04-23 MED ORDER — DAPAGLIFLOZIN PROPANEDIOL 10 MG PO TABS: 10.0000 mg | ORAL_TABLET | Freq: Every day | ORAL | 3 refills | Status: DC

## 2020-04-23 NOTE — Patient Instructions (Signed)
Medication Instructions:  Your physician has recommended you make the following change in your medication:  START: Inspra 25 mg take one tablet by mouth daily.  START; Farxiga 10 mg take one tablet by mouth daily.  *If you need a refill on your cardiac medications before your next appointment, please call your pharmacy*   Lab Work: Your physician recommends that you return for lab work in: 2 weeks BMP/ProBNP If you have labs (blood work) drawn today and your tests are completely normal, you will receive your results only by: Marland Kitchen MyChart Message (if you have MyChart) OR . A paper copy in the mail If you have any lab test that is abnormal or we need to change your treatment, we will call you to review the results.   Testing/Procedures: None   Follow-Up: At [facility], you and your health needs are our priority.  As part of our continuing mission to provide you with exceptional heart care, we have created designated Provider Care Teams.  These Care Teams include your primary Cardiologist (physician) and Advanced Practice Providers (APPs -  Physician Assistants and Nurse Practitioners) who all work together to provide you with the care you need, when you need it.  We recommend signing up for the patient portal called "MyChart".  Sign up information is provided on this After Visit Summary.  MyChart is used to connect with patients for Virtual Visits (Telemedicine).  Patients are able to view lab/test results, encounter notes, upcoming appointments, etc.  Non-urgent messages can be sent to your provider as well.   To learn more about what you can do with MyChart, go to [email or web address].    Your next appointment:   3 month(s)  The format for your next appointment:   In Person  Provider:   Norman Herrlich, MD   Other Instructions

## 2020-04-23 NOTE — Telephone Encounter (Signed)
Reviewed upcoming appointment times. Printed and mailed AVS to patient as appointments are not until May.

## 2020-04-23 NOTE — Telephone Encounter (Signed)
Patient's wife states they lost the paper for his procedure to put his pacemaker put in. She states they need to know what day the procedure is.

## 2020-04-26 NOTE — Telephone Encounter (Signed)
Unfortunately there is nothing to do about the cost of medications.  I would just discontinue the prescription.

## 2020-05-05 DIAGNOSIS — N202 Calculus of kidney with calculus of ureter: Secondary | ICD-10-CM | POA: Diagnosis not present

## 2020-05-05 DIAGNOSIS — E7800 Pure hypercholesterolemia, unspecified: Secondary | ICD-10-CM | POA: Diagnosis not present

## 2020-05-05 DIAGNOSIS — I5022 Chronic systolic (congestive) heart failure: Secondary | ICD-10-CM | POA: Diagnosis not present

## 2020-06-04 NOTE — Telephone Encounter (Signed)
Pt made aware procedure time change on 07/01/20 for his CRT-D implant. Aware to arrive at 10:30 am that day, NOT 8:30 am. Patient verbalized understanding and agreeable to plan.

## 2020-06-05 DIAGNOSIS — I5022 Chronic systolic (congestive) heart failure: Secondary | ICD-10-CM | POA: Diagnosis not present

## 2020-06-05 DIAGNOSIS — E7800 Pure hypercholesterolemia, unspecified: Secondary | ICD-10-CM | POA: Diagnosis not present

## 2020-06-05 DIAGNOSIS — N200 Calculus of kidney: Secondary | ICD-10-CM | POA: Diagnosis not present

## 2020-06-24 ENCOUNTER — Telehealth: Payer: Self-pay

## 2020-06-24 ENCOUNTER — Telehealth: Payer: Self-pay | Admitting: Cardiology

## 2020-06-24 DIAGNOSIS — I5022 Chronic systolic (congestive) heart failure: Secondary | ICD-10-CM

## 2020-06-24 DIAGNOSIS — R0602 Shortness of breath: Secondary | ICD-10-CM | POA: Diagnosis not present

## 2020-06-24 DIAGNOSIS — E782 Mixed hyperlipidemia: Secondary | ICD-10-CM

## 2020-06-24 DIAGNOSIS — I110 Hypertensive heart disease with heart failure: Secondary | ICD-10-CM

## 2020-06-24 DIAGNOSIS — Z789 Other specified health status: Secondary | ICD-10-CM

## 2020-06-24 NOTE — Telephone Encounter (Signed)
Patient has questions about upcoming procedure that is scheduled for next week. He stated he has called several times and has not heard back from anyone. Please call patient ASAP.  Thank you.

## 2020-06-24 NOTE — Telephone Encounter (Signed)
Encounter started to put in lab orders.

## 2020-06-25 ENCOUNTER — Telehealth: Payer: Self-pay

## 2020-06-25 LAB — CBC
Hematocrit: 49.7 % (ref 37.5–51.0)
Hemoglobin: 16.6 g/dL (ref 13.0–17.7)
MCH: 29.5 pg (ref 26.6–33.0)
MCHC: 33.4 g/dL (ref 31.5–35.7)
MCV: 88 fL (ref 79–97)
Platelets: 192 10*3/uL (ref 150–450)
RBC: 5.62 x10E6/uL (ref 4.14–5.80)
RDW: 13.3 % (ref 11.6–15.4)
WBC: 6.9 10*3/uL (ref 3.4–10.8)

## 2020-06-25 LAB — BASIC METABOLIC PANEL
BUN/Creatinine Ratio: 11 (ref 10–24)
BUN: 12 mg/dL (ref 8–27)
CO2: 19 mmol/L — ABNORMAL LOW (ref 20–29)
Calcium: 9.1 mg/dL (ref 8.6–10.2)
Chloride: 105 mmol/L (ref 96–106)
Creatinine, Ser: 1.07 mg/dL (ref 0.76–1.27)
Glucose: 95 mg/dL (ref 65–99)
Potassium: 4.3 mmol/L (ref 3.5–5.2)
Sodium: 141 mmol/L (ref 134–144)
eGFR: 77 mL/min/{1.73_m2} (ref >59)

## 2020-06-25 LAB — PRO B NATRIURETIC PEPTIDE: NT-Pro BNP: 95 pg/mL (ref 0–376)

## 2020-06-25 NOTE — Telephone Encounter (Signed)
Spoke to wife (pt not home), dpr on file. Informed that pt is to arrive at 11:30 am on day of procedure next week. Also aware Covid screening no longer required and will be cancelled. Patient verbalized understanding and agreeable to plan.   Wife asks if 2 people can come to husband's procedure. Aware I will look into this and let her know Monday. She appreciates the help with this.

## 2020-06-25 NOTE — Telephone Encounter (Signed)
Spoke with patient regarding results and recommendation.  Patient verbalizes understanding and is agreeable to plan of care. Advised patient to call back with any issues or concerns.  

## 2020-06-28 ENCOUNTER — Telehealth: Payer: Self-pay | Admitting: Cardiology

## 2020-06-28 NOTE — Telephone Encounter (Signed)
Pt informed that only 1 visitor is allowed in procedural visitor waiting area for now. Patient verbalized understanding and agreeable to plan.

## 2020-06-28 NOTE — Telephone Encounter (Signed)
Patient's wife would like to know if 2 people can come with the patient to his procedure tomorrow.

## 2020-06-29 ENCOUNTER — Other Ambulatory Visit (HOSPITAL_COMMUNITY): Payer: PPO

## 2020-06-30 NOTE — Pre-Procedure Instructions (Signed)
Attempted to call patient regarding tomorrows procedure. Left voice on the following items: Arrival time 1130 Nothing to eat or drink after midnight No meds AM of procedure Responsible person to drive you home and stay with you for 24 hrs Wash with special soap night before and morning of procedure

## 2020-07-01 ENCOUNTER — Ambulatory Visit (HOSPITAL_COMMUNITY)
Admission: RE | Disposition: A | Discharge status: Home / Self Care | Payer: Self-pay | Source: Ambulatory Visit | Attending: Cardiology

## 2020-07-01 ENCOUNTER — Other Ambulatory Visit: Payer: Self-pay

## 2020-07-01 ENCOUNTER — Ambulatory Visit (HOSPITAL_COMMUNITY)
Admission: RE | Admit: 2020-07-01 | Discharge: 2020-07-01 | Disposition: A | Discharge status: Home / Self Care | Payer: PPO | Source: Ambulatory Visit | Attending: Cardiology | Admitting: Cardiology

## 2020-07-01 ENCOUNTER — Ambulatory Visit (HOSPITAL_COMMUNITY): Payer: PPO

## 2020-07-01 DIAGNOSIS — E785 Hyperlipidemia, unspecified: Secondary | ICD-10-CM | POA: Diagnosis not present

## 2020-07-01 DIAGNOSIS — I110 Hypertensive heart disease with heart failure: Secondary | ICD-10-CM | POA: Diagnosis not present

## 2020-07-01 DIAGNOSIS — I447 Left bundle-branch block, unspecified: Secondary | ICD-10-CM | POA: Insufficient documentation

## 2020-07-01 DIAGNOSIS — Z95818 Presence of other cardiac implants and grafts: Secondary | ICD-10-CM

## 2020-07-01 DIAGNOSIS — I428 Other cardiomyopathies: Secondary | ICD-10-CM | POA: Diagnosis not present

## 2020-07-01 DIAGNOSIS — Z8249 Family history of ischemic heart disease and other diseases of the circulatory system: Secondary | ICD-10-CM | POA: Diagnosis not present

## 2020-07-01 DIAGNOSIS — I5022 Chronic systolic (congestive) heart failure: Secondary | ICD-10-CM | POA: Diagnosis not present

## 2020-07-01 DIAGNOSIS — Z950 Presence of cardiac pacemaker: Secondary | ICD-10-CM | POA: Diagnosis not present

## 2020-07-01 DIAGNOSIS — E8881 Metabolic syndrome: Secondary | ICD-10-CM | POA: Insufficient documentation

## 2020-07-01 HISTORY — PX: BIV ICD INSERTION CRT-D: EP1195

## 2020-07-01 SURGERY — BIV ICD INSERTION CRT-D

## 2020-07-01 MED ORDER — FENTANYL CITRATE (PF) 100 MCG/2ML IJ SOLN
INTRAMUSCULAR | Status: AC
  Filled 2020-07-01: qty 2

## 2020-07-01 MED ORDER — MIDAZOLAM HCL 5 MG/5ML IJ SOLN
INTRAMUSCULAR | Status: AC
  Filled 2020-07-01: qty 5

## 2020-07-01 MED ORDER — FENTANYL CITRATE (PF) 100 MCG/2ML IJ SOLN
INTRAMUSCULAR | Status: DC | PRN
  Administered 2020-07-01 (×3): 25 ug via INTRAVENOUS

## 2020-07-01 MED ORDER — LIDOCAINE HCL 1 % IJ SOLN
INTRAMUSCULAR | Status: AC
  Filled 2020-07-01: qty 60

## 2020-07-01 MED ORDER — CEFAZOLIN SODIUM-DEXTROSE 2-4 GM/100ML-% IV SOLN
INTRAVENOUS | Status: AC
  Filled 2020-07-01: qty 100

## 2020-07-01 MED ORDER — MIDAZOLAM HCL 5 MG/5ML IJ SOLN
INTRAMUSCULAR | Status: DC | PRN
  Administered 2020-07-01 (×5): 1 mg via INTRAVENOUS

## 2020-07-01 MED ORDER — HEPARIN (PORCINE) IN NACL 1000-0.9 UT/500ML-% IV SOLN
INTRAVENOUS | Status: AC
  Filled 2020-07-01: qty 500

## 2020-07-01 MED ORDER — ONDANSETRON HCL 4 MG/2ML IJ SOLN: 4.0000 mg | Freq: Four times a day (QID) | INTRAMUSCULAR | Status: DC | PRN

## 2020-07-01 MED ORDER — ACETAMINOPHEN 325 MG PO TABS
325.0000 mg | ORAL_TABLET | ORAL | Status: DC | PRN
  Administered 2020-07-01: 650 mg via ORAL
  Filled 2020-07-01 (×3): qty 2

## 2020-07-01 MED ORDER — HEPARIN (PORCINE) IN NACL 1000-0.9 UT/500ML-% IV SOLN
INTRAVENOUS | Status: DC | PRN
  Administered 2020-07-01: 500 mL

## 2020-07-01 MED ORDER — HYDRALAZINE HCL 20 MG/ML IJ SOLN
INTRAMUSCULAR | Status: AC
  Administered 2020-07-01: 10 mg
  Filled 2020-07-01: qty 1

## 2020-07-01 MED ORDER — SODIUM CHLORIDE 0.9 % IV SOLN: INTRAVENOUS | Status: DC

## 2020-07-01 MED ORDER — TRAMADOL HCL 50 MG PO TABS: 50.0000 mg | ORAL_TABLET | Freq: Once | ORAL | Status: DC

## 2020-07-01 MED ORDER — SODIUM CHLORIDE 0.9 % IV SOLN
80.0000 mg | INTRAVENOUS | Status: AC
  Administered 2020-07-01: 80 mg

## 2020-07-01 MED ORDER — SODIUM CHLORIDE 0.9 % IV SOLN
INTRAVENOUS | Status: AC
  Filled 2020-07-01: qty 2

## 2020-07-01 MED ORDER — LIDOCAINE HCL (PF) 1 % IJ SOLN
INTRAMUSCULAR | Status: DC | PRN
  Administered 2020-07-01: 45 mL

## 2020-07-01 MED ORDER — CHLORHEXIDINE GLUCONATE 4 % EX LIQD: 4.0000 "application " | Freq: Once | CUTANEOUS | Status: DC

## 2020-07-01 MED ORDER — HYDRALAZINE HCL 20 MG/ML IJ SOLN: 10.0000 mg | Freq: Once | INTRAMUSCULAR | Status: DC

## 2020-07-01 MED ORDER — IOHEXOL 350 MG/ML SOLN
INTRAVENOUS | Status: DC | PRN
  Administered 2020-07-01: 6 mL

## 2020-07-01 MED ORDER — CEFAZOLIN SODIUM-DEXTROSE 1-4 GM/50ML-% IV SOLN
1.0000 g | Freq: Four times a day (QID) | INTRAVENOUS | Status: DC
  Administered 2020-07-01: 1 g via INTRAVENOUS
  Filled 2020-07-01: qty 50

## 2020-07-01 MED ORDER — TRAMADOL HCL 50 MG PO TABS
ORAL_TABLET | ORAL | Status: AC
  Administered 2020-07-01: 50 mg
  Filled 2020-07-01: qty 1

## 2020-07-01 MED ORDER — CEFAZOLIN SODIUM-DEXTROSE 2-4 GM/100ML-% IV SOLN
2.0000 g | INTRAVENOUS | Status: AC
  Administered 2020-07-01: 2 g via INTRAVENOUS

## 2020-07-01 SURGICAL SUPPLY — 17 items
BALLN COR SINUS VENO 6FR 80 (BALLOONS) ×2
BALLOON COR SINUS VENO 6FR 80 (BALLOONS) ×1 IMPLANT
CABLE SURGICAL S-101-97-12 (CABLE) ×2 IMPLANT
CATH CPS DIRECT 135 DS2C020 (CATHETERS) ×2 IMPLANT
CPS IMPLANT KIT 410190 (MISCELLANEOUS) ×2 IMPLANT
ICD GALLANT HFCRTD CDHFA500Q (ICD Generator) ×2 IMPLANT
LEAD DURATA 7122Q-65CM (Lead) ×2 IMPLANT
LEAD QUARTET 1458QL-86 (Lead) ×1 IMPLANT
LEAD TENDRIL MRI 52CM LPA1200M (Lead) ×2 IMPLANT
MAT PREVALON FULL STRYKER (MISCELLANEOUS) ×2 IMPLANT
PAD PRO RADIOLUCENT 2001M-C (PAD) ×2 IMPLANT
QUARTET 1458QL-86 (Lead) ×2 IMPLANT
SHEATH 7FR PRELUDE SNAP 13 (SHEATH) IMPLANT
SHEATH 8FR PRELUDE SNAP 13 (SHEATH) ×4 IMPLANT
TRAY PACEMAKER INSERTION (PACKS) ×2 IMPLANT
WIRE ACUITY WHISPER EDS 4648 (WIRE) ×2 IMPLANT
WIRE HI TORQ VERSACORE-J 145CM (WIRE) ×2 IMPLANT

## 2020-07-01 NOTE — H&P (Signed)
Electrophysiology Office Note   Date:  07/01/2020   ID:  Andrews, Tener [birth year], MRN [id number]  PCP:  Buckner Malta, MD  Cardiologist:  Dulce Sellar Primary Electrophysiologist:  Czar Ysaguirre [name], MD    Chief Complaint: CHF   History of Present Illness: Victor Hood is a 65 y.o. male who is being seen today for the evaluation of CHF at the request of No ref. provider found. Presenting today for electrophysiology evaluation.  He has a history significant for chronic systolic heart failure, hypertension, hyperlipidemia.  Echo 03/04/2019 showed an ejection fraction of 30 to 35%.  Repeat echo 03/03/2020 showed a persistently low ejection fraction.  Coronary CT showed no flow-limiting stenosis.  He has a left bundle branch block.  He was initially planned for CRT-D implant, though he wanted to wait until a repeat echo was done.  Today, denies symptoms of palpitations, chest pain, shortness of breath, orthopnea, PND, lower extremity edema, claudication, dizziness, presyncope, syncope, bleeding, or neurologic sequela. The patient is tolerating medications without difficulties.     Past Medical History:  Diagnosis Date  . Chronic left systolic heart failure (HCC) 08/02/2017  . Hyperlipidemia 08/02/2017  . Hypertensive heart disease with heart failure (HCC) 08/02/2017  . Kidney stone 08/02/2017  . Metabolic syndrome 08/02/2017  . Statin intolerance 07/04/2016   Past Surgical History:  Procedure Laterality Date  . BACK SURGERY  2007   decrompressed herniated disc  . FOREARM FRACTURE SURGERY Left 1976  . HUMERUS FRACTURE SURGERY Left 1976     Current Facility-Administered Medications  Medication Dose Route Frequency Provider Last Rate Last Admin  . 0.9 %  sodium chloride infusion   Intravenous Continuous Mathius Birkeland Daphine Deutscher, MD      . ceFAZolin (ANCEF) IVPB 2g/100 mL premix  2 g Intravenous On Call Weslyn Holsonback Daphine Deutscher, MD      . chlorhexidine (HIBICLENS) 4 % liquid 4  application  4 application Topical Once Aundre Hietala Daphine Deutscher, MD      . gentamicin (GARAMYCIN) 80 mg in sodium chloride 0.9 % 500 mL irrigation  80 mg Irrigation On Call Jackelyne Sayer, Andree Coss, MD        Allergies:   Tape   Social History:  The patient  reports that he has never smoked. He has never used smokeless tobacco. He reports current alcohol use of about 7.0 - 14.0 standard drinks of alcohol per week.   Family History:  The patient's family history includes CVA in his mother; Congestive Heart Failure in his father and mother; Diabetes in his mother; Hypercholesterolemia in his maternal grandfather, maternal grandmother, and mother; Pancreatic cancer in his paternal uncle; Prostate cancer in his paternal uncle.   ROS:  Please see the history of present illness.   Otherwise, review of systems is positive for none.   All other systems are reviewed and negative.   PHYSICAL EXAM: VS:  BP (!) 166/93   Pulse (!) 50   Temp 98.2 F (36.8 C) (Oral)   Ht 5\' 10"  (1.778 m)   Wt 88.5 kg   SpO2 100%   BMI 27.98 kg/m  , BMI Body mass index is 27.98 kg/m. GEN: Well nourished, well developed, in no acute distress  HEENT: normal  Neck: no JVD, carotid bruits, or masses Cardiac: RRR; no murmurs, rubs, or gallops,no edema  Respiratory:  clear to auscultation bilaterally, normal work of breathing GI: soft, nontender, nondistended, + BS MS: no deformity or atrophy  Skin: warm and dry Neuro:  Strength and sensation are intact Psych: euthymic mood, full affect   Recent Labs: 06/24/2020: BUN 12; Creatinine, Ser 1.07; Hemoglobin 16.6; NT-Pro BNP 95; Platelets 192; Potassium 4.3; Sodium 141    Lipid Panel     Component Value Date/Time   CHOL 196 09/12/2017 1034   TRIG 262 (H) 09/12/2017 1034   HDL 32 (L) 09/12/2017 1034   CHOLHDL 6.1 (H) 09/12/2017 1034   LDLCALC 112 (H) 09/12/2017 1034     Wt Readings from Last 3 Encounters:  07/01/20 88.5 kg  04/23/20 88.5 kg  04/12/20 90.7 kg       Other studies Reviewed: Additional studies/ records that were reviewed today include: TTE 03/03/2020 Review of the above records today demonstrates:  1. Left ventricular ejection fraction, by estimation, is 30 to 35%. The  left ventricle has moderately decreased function. The left ventricle has  no regional wall motion abnormalities. There is moderate left ventricular  hypertrophy. Left ventricular  diastolic parameters are consistent with Grade I diastolic dysfunction  (impaired relaxation).  2. Right ventricular systolic function is normal. The right ventricular  size is normal. There is normal pulmonary artery systolic pressure.  3. The mitral valve is normal in structure. Mild mitral valve  regurgitation. No evidence of mitral stenosis.  4. The aortic valve is normal in structure. Aortic valve regurgitation is  not visualized. No aortic stenosis is present.  5. The inferior vena cava is normal in size with greater than 50%  respiratory variability, suggesting right atrial pressure of 3 mmHg.   Coronary CTA 01/16/2020 1. Moderate Coronary artery disease. CADRADS 3. This study Rubin Dais be send for FFRct.  2. Coronary calcium score of 449. This was 64 percentile for age and sex matched control.  3. Normal coronary origin with right dominance.  1. Left Main: 0.97  2. LAD: Proximal 0.90  Mid 0.80 distal 0.79  3. LCX: Proximal 0.70  Mid 0.60 distal not analyzed  4. RCA: Proximal 0.86 Mid 0.79  Distal 0.76   ASSESSMENT AND PLAN:  1.  Chronic systolic heart failure:  ICD Criteria  Current LVEF:33%. Within 12 months prior to implant: Yes   Heart failure history: Yes, Class II  Cardiomyopathy history: Yes, Non-Ischemic Cardiomyopathy.  Atrial Fibrillation/Atrial Flutter: No.  Ventricular tachycardia history: No.  Cardiac arrest history: No.  History of syndromes with risk of sudden death: No.  Previous ICD: No.  Current ICD indication: Primary  PPM  indication: No.  Class I or II Bradycardia indication present: No  Beta Blocker therapy for 3 or more months: Yes, prescribed.   Ace Inhibitor/ARB therapy for 3 or more months: Yes, prescribed.    I have seen BUBBA VANBENSCHOTEN is a 65 y.o. malepre-procedural and has been referred by [facility] for consideration of ICD implant for primary prevention of sudden death.  The patient's chart has been reviewed and they meet criteria for ICD implant.  I have had a thorough discussion with the patient reviewing options.  The patient and their family (if available) have had opportunities to ask questions and have them answered. The patient and I have decided together through the [facility] Care Share Decision Support Tool to implant ICD at this time.  Risks, benefits, alternatives to ICD implantation were discussed in detail with the patient today. The patient  understands that the risks include but are not limited to bleeding, infection, pneumothorax, perforation, tamponade, vascular damage, renal failure, MI, stroke, death, inappropriate shocks, and lead dislodgement and wishes to proceed.

## 2020-07-01 NOTE — Progress Notes (Signed)
Dr Camnitz notified of CXR results and ok to d/c home 

## 2020-07-01 NOTE — Discharge Instructions (Signed)
Cardioverter Defibrillator Implantation, Care After The following information offers guidance on how to care for yourself after your procedure. Your health care provider may also give you more specific instructions. If you have problems or questions, contact your health care provider. What can I expect after the procedure? After the procedure, it is common to have:  Some pain. Pain may last a few days.  A slight bump over the skin where the device was placed. Sometimes, it is possible to feel the device under the skin. This is normal. During the months and years after your procedure, your health care provider will check the device, the wires (leads), and the battery every few months. The generator is monitored over time and will be replaced when the battery is depleted. Follow these instructions at home: Medicines  Take over-the-counter and prescription medicines only as told by your health care provider.  If you were prescribed an antibiotic medicine, take it as told by your health care provider. Do not stop taking the antibiotic even if you start to feel better. Bathing  Do not take baths, swim, or use a hot tub until your health care provider approves.  Ask your health care provider when you may take showers. You may only be allowed to take sponge baths. Cover your incision with a watertight covering if you take a sponge bath. Incision care  Follow instructions from your health care provider about how to take care of your incision. Make sure you: ? Wash your hands with soap and water for at least 20 seconds before and after you change your bandage (dressing). If soap and water are not available, use hand sanitizer. ? Change your dressing as told by your health care provider. ? Leave stitches (sutures), skin glue, adhesive strips, or staples in place. These skin closures may need to stay in place for 2 weeks or longer. If adhesive strip edges start to loosen and curl up, you may trim the  loose edges. Do not remove adhesive strips completely unless your health care provider tells you to do that.  Check your incision area every day for signs of infection. Check for: ? Redness, swelling, or more pain. ? Fluid or blood. ? Warmth. ? Pus or a bad smell.  Do not use lotions or ointments near the incision area unless told by your health care provider.  Do not wear tight clothes or clothes that could rub on your incision.   Activity  Do not lift anything that is heavier than 10 lb (4.5 kg), or the limit that you are told, until your health care provider says that it is safe.  Return to your normal activities as told by your health care provider. Ask your health care provider what activities are safe for you. Follow instructions from your health care provider about sports, exercise, work, and sexual activity restrictions after your procedure.  Avoid sitting for a long time without moving. Get up to take short walks every 1-2 hours. This is important to improve blood flow and breathing. Ask for help if you feel weak or unsteady.  For at least 6 weeks: ? Do not lift your upper arm above your shoulders. You may be given a sling to use. This means no tennis, golf, or swimming for this period of time. ? If you tend to sleep with your arm above your head, use a restraint, such as a sling, to prevent movement during sleep. ? You should use your shoulder on the side of the defibrillator  in daily tasks that do not require a lot of motion. ? Avoid sudden jerking, pulling, or chopping movements that pull your upper arm far away from your body.  Do not drive or operate machinery until your health care provider says that it is safe.  Participate in a cardiac rehabilitation program as told by your health care provider. A cardiac rehabilitation program is a treatment program to improve your health and well-being through exercise training, education, and counseling.   Electric and magnetic  fields  Tell all health care providers, including your dentist, that you have a defibrillator. Some medical devices and imaging methods such as MRI may affect your device.  If you must pass through a metal detector, quickly walk through it. Do not stop under the detector. Do not stand near it.  Avoid places or objects that have a strong electric or magnetic field, including: ? Airport security gates. At the airport, let officials know that you have a defibrillator. Your defibrillator ID card will let you be checked in a way that is safe for you and will not damage your defibrillator. Do not let a security person wave a magnetic wand near your defibrillator. That can make it stop working. ? Power plants. ? Large electrical generators. ? Anti-theft systems or electronic article surveillance (EAS). ? Radiofrequency transmission towers, such as mobile phone and radio towers.  Some household devices and appliances may produce electromagnetic waves that affect your defibrillator. If you are not sure if something is safe to use, ask your health care provider. ? When using your mobile phone, hold it to the ear that is on the opposite side from the defibrillator. Do not leave your mobile phone in a pocket over the defibrillator. ? Many devices contain magnets. Examples include headphones and electronic cigarettes. Do not put them in a pocket near your device. If they are wearable, wear them as far away from the device as possible. ? Some devices are safe to use if they are held at least 12 inches (30 cm) away from your defibrillator. These include power tools, chain saws, lawn mowers, and speakers. ? You may safely use electric blankets, heating pads, computers, and microwave ovens. ? Do not use amateur or ham radio equipment, electric (arc) welding torches, magnetic mattresses or chairs, and body fat measuring scales. General instructions  Always keep your defibrillator ID card with you. The card should  list the implant date, device model, and manufacturer. Consider wearing a medical alert bracelet or necklace.  Have your defibrillator checked as often as told by your health care provider. Most defibrillators last for 5-7 years.  Do not use any products that contain nicotine or tobacco. These products include cigarettes, chewing tobacco, and vaping devices, such as e-cigarettes. If you need help quitting, ask your health care provider.  Discuss specific device restrictions with your device manufacturer or health care provider.  Get screened for depression and anxiety, and seek treatment if needed.  Keep all follow-up visits. This is important. Contact a health care provider if:  You feel one shock in your chest.  You gain weight suddenly.  You have a fever.  You have swelling in your arm on the same side of your body as the device or have swelling in your legs or feet.  It feels like your heart is fluttering or skipping beats (heart palpitations).  You have any of these signs of infection around your incision: ? Redness, swelling, or more pain. ? Fluid or blood. ?   Warmth. ? Pus or a bad smell.  You are feeling depressed, scared, anxious, or sad. Get help right away if:  You have chest pain.  You feel more than one shock.  You have problems breathing or have shortness of breath.  You have dizziness or fainting.  Your incision starts to open up. These symptoms may represent a serious problem that is an emergency. Do not wait to see if the symptoms will go away. Get medical help right away. Call your local emergency services (911 in the [place] Do not drive yourself to the hospital. Summary  It is important to follow your activity restrictions for at least 6 weeks following your device implantation. Do not lift your upper arm above your shoulder.  Always keep your defibrillator ID card with you. The card should list the implant date, device model, and manufacturer. Consider  wearing a medical alert bracelet or necklace.  Follow the device distance restrictions for electric and magnetic fields.  Keep all follow-up visits with your health care provider for monitoring of your defibrillator.  Contact your health care provider if you have signs of an infection such as a fever or redness, swelling, or more pain around your incision. Get help right away if you have chest pain, receive more than one shock, or faint. This information is not intended to replace advice given to you by your health care provider. Make sure you discuss any questions you have with your health care provider. Document Revised: 07/23/2019 Document Reviewed: 07/23/2019 Elsevier Patient Education  2021 [employer].         Supplemental Discharge Instructions for  Pacemaker/Defibrillator Patients  Tomorrow, 07/02/20, send in a device transmission  Activity No heavy lifting or vigorous activity with your left/right arm for 6 to 8 weeks.  Do not raise your left/right arm above your head for one week.  Gradually raise your affected arm as drawn below.              07/08/20                      07/09/20                      07/10/20                     07/11/20 __  NO DRIVING until cleared to at your wound check visit  WOUND CARE - Keep the wound area clean and dry.  Do not get this area wet , no showers until cleared to at your wound check visit. - Tomorrow, 07/11/20, remove the arm sling - Tomorrow, 07/11/20 remove the LARGE outer plastic bandage.  Underneath the plastic bandage there are steri strips (paper tapes), DO NOT remove these. - The tape/steri-strips on your wound will fall off; do not pull them off.  No bandage is needed on the site.  DO  NOT apply any creams, oils, or ointments to the wound area. - If you notice any drainage or discharge from the wound, any swelling or bruising at the site, or you develop a fever > 101? F after you are discharged home, call the office at once.  Special  Instructions - You are still able to use cellular telephones; use the ear opposite the side where you have your pacemaker/defibrillator.  Avoid carrying your cellular phone near your device. - When traveling through airports, show security personnel your identification card to avoid being screened in  the metal detectors.  Ask the security personnel to use the hand wand. - Avoid arc welding equipment, MRI testing (magnetic resonance imaging), TENS units (transcutaneous nerve stimulators).  Call the office for questions about other devices. - Avoid electrical appliances that are in poor condition or are not properly grounded. - Microwave ovens are safe to be near or to operate.  Additional information for defibrillator patients should your device go off: - If your device goes off ONCE and you feel fine afterward, notify the device clinic nurses. - If your device goes off ONCE and you do not feel well afterward, call 911. - If your device goes off TWICE, call 911. - If your device goes off THREE times in one day, call 911.  DO NOT DRIVE YOURSELF OR A FAMILY MEMBER WITH A DEFIBRILLATOR TO THE HOSPITAL--CALL 911.

## 2020-07-02 ENCOUNTER — Encounter (HOSPITAL_COMMUNITY): Payer: Self-pay | Admitting: Cardiology

## 2020-07-02 MED FILL — Lidocaine HCl Local Inj 1%: INTRAMUSCULAR | Qty: 45 | Status: AC

## 2020-07-13 ENCOUNTER — Other Ambulatory Visit: Payer: Self-pay

## 2020-07-13 ENCOUNTER — Ambulatory Visit (INDEPENDENT_AMBULATORY_CARE_PROVIDER_SITE_OTHER): Payer: PPO | Admitting: Emergency Medicine

## 2020-07-13 DIAGNOSIS — I5022 Chronic systolic (congestive) heart failure: Secondary | ICD-10-CM

## 2020-07-13 LAB — CUP PACEART INCLINIC DEVICE CHECK
Battery Remaining Longevity: 56 mo
Brady Statistic RA Percent Paced: 48 %
Brady Statistic RV Percent Paced: 97 %
Date Time Interrogation Session: 20220607122537
HighPow Impedance: 58.5 Ohm
Implantable Lead Implant Date: 20220526
Implantable Lead Implant Date: 20220526
Implantable Lead Implant Date: 20220526
Implantable Lead Location: 753858
Implantable Lead Location: 753859
Implantable Lead Location: 753860
Implantable Pulse Generator Implant Date: 20220526
Lead Channel Impedance Value: 425 Ohm
Lead Channel Impedance Value: 462.5 Ohm
Lead Channel Impedance Value: 550 Ohm
Lead Channel Pacing Threshold Amplitude: 0.5 V
Lead Channel Pacing Threshold Amplitude: 0.5 V
Lead Channel Pacing Threshold Amplitude: 0.5 V
Lead Channel Pacing Threshold Amplitude: 0.5 V
Lead Channel Pacing Threshold Amplitude: 0.5 V
Lead Channel Pacing Threshold Amplitude: 0.5 V
Lead Channel Pacing Threshold Pulse Width: 0.5 ms
Lead Channel Pacing Threshold Pulse Width: 0.5 ms
Lead Channel Pacing Threshold Pulse Width: 0.5 ms
Lead Channel Pacing Threshold Pulse Width: 0.5 ms
Lead Channel Pacing Threshold Pulse Width: 0.5 ms
Lead Channel Pacing Threshold Pulse Width: 0.5 ms
Lead Channel Sensing Intrinsic Amplitude: 1.2 mV
Lead Channel Sensing Intrinsic Amplitude: 9.7 mV
Lead Channel Setting Pacing Amplitude: 3.5 V
Lead Channel Setting Pacing Amplitude: 3.5 V
Lead Channel Setting Pacing Amplitude: 3.5 V
Lead Channel Setting Pacing Pulse Width: 0.5 ms
Lead Channel Setting Pacing Pulse Width: 0.5 ms
Lead Channel Setting Sensing Sensitivity: 0.5 mV
Pulse Gen Serial Number: 111044700

## 2020-07-13 NOTE — Progress Notes (Signed)
Wound check appointment. Steri-strips removed. Wound without redness or edema. Incision edges approximated, wound well healed. Normal device function. Thresholds, sensing, and impedances consistent with implant measurements. Device programmed at 3.5V for extra safety margin until 3 month visit. Histogram distribution appropriate for patient and level of activity.  Patient is BiV pacing 97% of the time.   No mode switches or ventricular arrhythmias noted. Patient educated about wound care, arm mobility, lifting restrictions, shock plan. ROV with Dr. Elberta Fortis on 10/04/20 in [place]. Patient is enrolled in remote monitoring, next scheduled check 10/01/20.

## 2020-08-03 NOTE — Progress Notes (Signed)
Cardiology Office Note:    Date:  08/04/2020   ID:  Victor Hood, DOB [birth year], MRN [id number]  PCP:  Buckner Malta, MD  Cardiologist:  Norman Herrlich, MD    Referring MD: Buckner Malta, MD    ASSESSMENT:    1. Chronic left systolic heart failure (HCC)   2. Left bundle branch block   3. Hypertensive heart disease with heart failure (HCC)   4. Mixed hyperlipidemia   5. Statin intolerance   6. Coronary artery disease of native artery of native heart with stable angina pectoris (HCC)   7. Biventricular cardiac pacemaker in situ    PLAN:    In order of problems listed above:  Stable New York Heart Association class I, SGLT2 inhibitor is not an option with coverage of insurance and cost out-of-pocket I placed him back on MRA and continue carvedilol and high-dose Entresto.  Recheck echocardiogram in 3 months prior to his visit to assess response to CRT BP is at target on guideline directed therapy Continue with statin and Zetia check lipid profile in 2 weeks and recheck BMP after starting MRA Stable CAD New York Heart Association class I continue medical therapy aspirin beta-blocker lipid-lowering Stable device is followed in our clinic   Next appointment: 3 months   Medication Adjustments/Labs and Tests Ordered: Current medicines are reviewed at length with the patient today.  Concerns regarding medicines are outlined above.  No orders of the defined types were placed in this encounter.  No orders of the defined types were placed in this encounter.   Chief Complaint  Patient presents with   Follow-up   Congestive Heart Failure   Cardiomyopathy     History of Present Illness:    Victor Hood is a 65 y.o. male with a hx of chronic systolic heart failure EF 30 to 35% with left bundle branch block and prolonged QRS duration with guideline directed therapy hypertensive heart disease hyperlipidemia and coronary artery disease last seen 04/23/2020 and referred to  EP for consideration of CRT cardiac resynchronization.Marland KitchenHe underwent cardiac CTA reported on 01/16/2020 with a high calcium score 449 81st percentile for age and sex and had moderate coronary artery disease.  Right coronary artery less than 25% left main less than 25% left anterior descending coronary artery with moderate proximal to mid 50 to 69% stenosis in the same in the first marginal branch left circumflex was free of stenosis.  He had biventricular pacemaker defibrillator inserted 07/01/2020.  Compliance with diet, lifestyle and medications: Yes  He is not taking SGLT2 inhibitor because of insurance coverage and cost He never filled a prescription for MRA He feels improved strength and endurance no shortness of breath edema chest pain palpitation or syncope no pain or redness at his pocket site Past Medical History:  Diagnosis Date   Chronic left systolic heart failure (HCC) 08/02/2017   Hyperlipidemia 08/02/2017   Hypertensive heart disease with heart failure (HCC) 08/02/2017   Kidney stone 08/02/2017   Metabolic syndrome 08/02/2017   Statin intolerance 07/04/2016    Past Surgical History:  Procedure Laterality Date   BACK SURGERY  2007   decrompressed herniated disc   BIV ICD INSERTION CRT-D N/A 07/01/2020   Procedure: BIV ICD INSERTION CRT-D;  Surgeon: Regan Lemming, MD;  Location: [facility] INVASIVE CV LAB;  Service: Cardiovascular;  Laterality: N/A;   FOREARM FRACTURE SURGERY Left 1976   HUMERUS FRACTURE SURGERY Left 1976    Current Medications: Current Meds  Medication Sig  aspirin EC 81 MG tablet Take 81 mg by mouth daily.   carvedilol (COREG) 25 MG tablet Take 25 mg by mouth 2 (two) times daily with a meal.   ezetimibe (ZETIA) 10 MG tablet Take 10 mg by mouth daily.   Multiple Vitamins-Minerals (MULTIVITAMIN WITH MINERALS) tablet Take 1 tablet by mouth 3 (three) times a week.   pravastatin (PRAVACHOL) 80 MG tablet Take 80 mg by mouth 2 (two) times a week.    sacubitril-valsartan (ENTRESTO) 97-103 MG Take 1 tablet by mouth 2 (two) times daily.     Allergies:   Tape   Social History   Socioeconomic History   Marital status: Married    Spouse name: Not on file   Number of children: Not on file   Years of education: Not on file   Highest education level: Not on file  Occupational History   Not on file  Tobacco Use   Smoking status: Never   Smokeless tobacco: Never  Substance and Sexual Activity   Alcohol use: Yes    Alcohol/week: 7.0 - 14.0 standard drinks    Types: 7 - 14 Cans of beer per week   Drug use: Not on file   Sexual activity: Not on file  Other Topics Concern   Not on file  Social History Narrative   Not on file   Social Determinants of Health   Financial Resource Strain: Not on file  Food Insecurity: Not on file  Transportation Needs: Not on file  Physical Activity: Not on file  Stress: Not on file  Social Connections: Not on file     Family History: The patient's family history includes CVA in his mother; Congestive Heart Failure in his father and mother; Diabetes in his mother; Hypercholesterolemia in his maternal grandfather, maternal grandmother, and mother; Pancreatic cancer in his paternal uncle; Prostate cancer in his paternal uncle. ROS:   Please see the history of present illness.    All other systems reviewed and are negative.  EKGs/Labs/Other Studies Reviewed:    The following studies were reviewed today:    Recent Labs: 06/24/2020: BUN 12; Creatinine, Ser 1.07; Hemoglobin 16.6; NT-Pro BNP 95; Platelets 192; Potassium 4.3; Sodium 141  Recent Lipid Panel    Component Value Date/Time   CHOL 196 09/12/2017 1034   TRIG 262 (H) 09/12/2017 1034   HDL 32 (L) 09/12/2017 1034   CHOLHDL 6.1 (H) 09/12/2017 1034   LDLCALC 112 (H) 09/12/2017 1034    Physical Exam:    VS:  BP 138/72 (BP Location: Right Arm, Patient Position: Sitting, Cuff Size: Normal)   Pulse 64   Ht 5\' 10"  (1.778 m)   Wt 199 lb  (90.3 kg)   SpO2 96%   BMI 28.55 kg/m     Wt Readings from Last 3 Encounters:  08/04/20 199 lb (90.3 kg)  07/01/20 195 lb (88.5 kg)  04/23/20 195 lb (88.5 kg)     GEN:  Well nourished, well developed in no acute distress HEENT: Normal NECK: No JVD; No carotid bruits LYMPHATICS: No lymphadenopathy CARDIAC: Pacemaker pocket healed no effusion or redness RRR, no murmurs, rubs, gallops RESPIRATORY:  Clear to auscultation without rales, wheezing or rhonchi  ABDOMEN: Soft, non-tender, non-distended MUSCULOSKELETAL:  No edema; No deformity  SKIN: Warm and dry NEUROLOGIC:  Alert and oriented x 3 PSYCHIATRIC:  Normal affect    Signed, 04/25/20, MD  08/04/2020 1:20 PM     [facility]

## 2020-08-04 ENCOUNTER — Ambulatory Visit: Payer: PPO | Admitting: Cardiology

## 2020-08-04 ENCOUNTER — Encounter: Payer: Self-pay | Admitting: Cardiology

## 2020-08-04 ENCOUNTER — Other Ambulatory Visit: Payer: Self-pay

## 2020-08-04 VITALS — BP 138/72 | HR 64 | Ht 70.0 in | Wt 199.0 lb

## 2020-08-04 DIAGNOSIS — I5022 Chronic systolic (congestive) heart failure: Secondary | ICD-10-CM | POA: Diagnosis not present

## 2020-08-04 DIAGNOSIS — Z950 Presence of cardiac pacemaker: Secondary | ICD-10-CM | POA: Diagnosis not present

## 2020-08-04 DIAGNOSIS — E782 Mixed hyperlipidemia: Secondary | ICD-10-CM | POA: Diagnosis not present

## 2020-08-04 DIAGNOSIS — Z789 Other specified health status: Secondary | ICD-10-CM | POA: Diagnosis not present

## 2020-08-04 DIAGNOSIS — I25118 Atherosclerotic heart disease of native coronary artery with other forms of angina pectoris: Secondary | ICD-10-CM

## 2020-08-04 DIAGNOSIS — I447 Left bundle-branch block, unspecified: Secondary | ICD-10-CM

## 2020-08-04 DIAGNOSIS — I110 Hypertensive heart disease with heart failure: Secondary | ICD-10-CM | POA: Diagnosis not present

## 2020-08-04 MED ORDER — EPLERENONE 25 MG PO TABS: 25.0000 mg | ORAL_TABLET | Freq: Every day | ORAL | 3 refills | Status: DC

## 2020-08-04 NOTE — Patient Instructions (Signed)
Medication Instructions:  Your physician recommends that you continue on your current medications as directed. Please refer to the Current Medication list given to you today.  *If you need a refill on your cardiac medications before your next appointment, please call your pharmacy*   Lab Work: Your physician recommends that you return for lab work in: 2 weeks BMP, ProBNP If you have labs (blood work) drawn today and your tests are completely normal, you will receive your results only by: MyChart Message (if you have MyChart) OR A paper copy in the mail If you have any lab test that is abnormal or we need to change your treatment, we will call you to review the results.   Testing/Procedures: Your physician has requested that you have an echocardiogram in three months. Echocardiography is a painless test that uses sound waves to create images of your heart. It provides your doctor with information about the size and shape of your heart and how well your heart's chambers and valves are working. This procedure takes approximately one hour. There are no restrictions for this procedure.    Follow-Up: At [facility], you and your health needs are our priority.  As part of our continuing mission to provide you with exceptional heart care, we have created designated Provider Care Teams.  These Care Teams include your primary Cardiologist (physician) and Advanced Practice Providers (APPs -  Physician Assistants and Nurse Practitioners) who all work together to provide you with the care you need, when you need it.  We recommend signing up for the patient portal called "MyChart".  Sign up information is provided on this After Visit Summary.  MyChart is used to connect with patients for Virtual Visits (Telemedicine).  Patients are able to view lab/test results, encounter notes, upcoming appointments, etc.  Non-urgent messages can be sent to your provider as well.   To learn more about what you can do  with MyChart, go to [email or web address].    Your next appointment:   6 month(s)  The format for your next appointment:   In Person  Provider:   Norman Herrlich, MD   Other Instructions

## 2020-08-04 NOTE — Addendum Note (Signed)
Addended by: Delorse Limber I on: 08/04/2020 01:25 PM   Modules accepted: Orders

## 2020-10-01 ENCOUNTER — Ambulatory Visit (INDEPENDENT_AMBULATORY_CARE_PROVIDER_SITE_OTHER): Payer: PPO

## 2020-10-01 DIAGNOSIS — I5022 Chronic systolic (congestive) heart failure: Secondary | ICD-10-CM | POA: Diagnosis not present

## 2020-10-01 LAB — CUP PACEART REMOTE DEVICE CHECK
Battery Remaining Longevity: 56 mo
Battery Remaining Percentage: 94 %
Battery Voltage: 2.98 V
Brady Statistic AP VP Percent: 37 %
Brady Statistic AP VS Percent: 1.2 %
Brady Statistic AS VP Percent: 61 %
Brady Statistic AS VS Percent: 1 %
Brady Statistic RA Percent Paced: 37 %
Date Time Interrogation Session: 20220826021650
HighPow Impedance: 63 Ohm
Implantable Lead Implant Date: 20220526
Implantable Lead Implant Date: 20220526
Implantable Lead Implant Date: 20220526
Implantable Lead Location: 753858
Implantable Lead Location: 753859
Implantable Lead Location: 753860
Implantable Pulse Generator Implant Date: 20220526
Lead Channel Impedance Value: 410 Ohm
Lead Channel Impedance Value: 440 Ohm
Lead Channel Impedance Value: 740 Ohm
Lead Channel Pacing Threshold Amplitude: 0.5 V
Lead Channel Pacing Threshold Amplitude: 0.5 V
Lead Channel Pacing Threshold Amplitude: 0.5 V
Lead Channel Pacing Threshold Pulse Width: 0.5 ms
Lead Channel Pacing Threshold Pulse Width: 0.5 ms
Lead Channel Pacing Threshold Pulse Width: 0.5 ms
Lead Channel Sensing Intrinsic Amplitude: 1.6 mV
Lead Channel Sensing Intrinsic Amplitude: 9.5 mV
Lead Channel Setting Pacing Amplitude: 3.5 V
Lead Channel Setting Pacing Amplitude: 3.5 V
Lead Channel Setting Pacing Amplitude: 3.5 V
Lead Channel Setting Pacing Pulse Width: 0.5 ms
Lead Channel Setting Pacing Pulse Width: 0.5 ms
Lead Channel Setting Sensing Sensitivity: 0.5 mV
Pulse Gen Serial Number: 111044700

## 2020-10-03 NOTE — Progress Notes (Signed)
Electrophysiology Office Note   Date:  10/04/2020   ID:  Victor, Hood 11/28/1955, MRN [id number]  PCP:  Buckner Malta, MD  Cardiologist:  Dulce Sellar Primary Electrophysiologist:  Dioselina Brumbaugh [name], MD    Chief Complaint: CHF   History of Present Illness: Victor Hood is a 65 y.o. male who is being seen today for the evaluation of CHF at the request of Buckner Malta, MD. Presenting today for electrophysiology evaluation.  He has a history significant for chronic systolic heart failure, hypertension, and hyperlipidemia.  Echo 03/04/2019 showed an ejection fraction of 30 to 35%.  Repeat echo 1 year later showed a persistently low ejection fraction.  He is now status post Abbott CRT-D implanted 07/01/2020.  Today, denies symptoms of palpitations, chest pain, shortness of breath, orthopnea, PND, lower extremity edema, claudication, dizziness, presyncope, syncope, bleeding, or neurologic sequela. The patient is tolerating medications without difficulties.  Since his device was implanted he has felt well.  He has no chest pain or shortness of breath.  He is able to all of his daily activities without restriction.  He actually feels better with more energy.   Past Medical History:  Diagnosis Date   Chronic left systolic heart failure (HCC) 08/02/2017   Hyperlipidemia 08/02/2017   Hypertensive heart disease with heart failure (HCC) 08/02/2017   Kidney stone 08/02/2017   Metabolic syndrome 08/02/2017   Statin intolerance 07/04/2016   Past Surgical History:  Procedure Laterality Date   BACK SURGERY  2007   decrompressed herniated disc   BIV ICD INSERTION CRT-D N/A 07/01/2020   Procedure: BIV ICD INSERTION CRT-D;  Surgeon: Regan Lemming, MD;  Location: [facility] INVASIVE CV LAB;  Service: Cardiovascular;  Laterality: N/A;   FOREARM FRACTURE SURGERY Left 1976   HUMERUS FRACTURE SURGERY Left 1976     Current Outpatient Medications  Medication Sig Dispense Refill   aspirin EC  81 MG tablet Take 81 mg by mouth daily.     carvedilol (COREG) 25 MG tablet Take 25 mg by mouth 2 (two) times daily with a meal.     eplerenone (INSPRA) 25 MG tablet Take 1 tablet (25 mg total) by mouth daily. 90 tablet 3   ezetimibe (ZETIA) 10 MG tablet Take 10 mg by mouth daily.     Multiple Vitamins-Minerals (MULTIVITAMIN WITH MINERALS) tablet Take 1 tablet by mouth 3 (three) times a week.     pravastatin (PRAVACHOL) 80 MG tablet Take 80 mg by mouth 2 (two) times a week.     sacubitril-valsartan (ENTRESTO) 97-103 MG Take 1 tablet by mouth 2 (two) times daily. 180 tablet 3   dapagliflozin propanediol (FARXIGA) 10 MG TABS tablet Take 1 tablet (10 mg total) by mouth daily before breakfast. (Patient not taking: Reported on 10/04/2020) 90 tablet 3   No current facility-administered medications for this visit.    Allergies:   Tape   Social History:  The patient  reports that he has never smoked. He has never used smokeless tobacco. He reports current alcohol use of about 7.0 - 14.0 standard drinks per week.   Family History:  The patient's family history includes CVA in his mother; Congestive Heart Failure in his father and mother; Diabetes in his mother; Hypercholesterolemia in his maternal grandfather, maternal grandmother, and mother; Pancreatic cancer in his paternal uncle; Prostate cancer in his paternal uncle.   ROS:  Please see the history of present illness.   Otherwise, review of systems is positive for none.   All  other systems are reviewed and negative.   PHYSICAL EXAM: VS:  BP 130/74   Pulse 60   Ht 5\' 10"  (1.778 m)   Wt 188 lb (85.3 kg)   SpO2 98%   BMI 26.98 kg/m  , BMI Body mass index is 26.98 kg/m. GEN: Well nourished, well developed, in no acute distress  HEENT: normal  Neck: no JVD, carotid bruits, or masses Cardiac: RRR; no murmurs, rubs, or gallops,no edema  Respiratory:  clear to auscultation bilaterally, normal work of breathing GI: soft, nontender, nondistended, +  BS MS: no deformity or atrophy  Skin: warm and dry, device site well healed Neuro:  Strength and sensation are intact Psych: euthymic mood, full affect  EKG:  EKG is ordered today. Personal review of the ekg ordered shows AV paced, rate 60  Personal review of the device interrogation today. Results in Paceart   Recent Labs: 06/24/2020: BUN 12; Creatinine, Ser 1.07; Hemoglobin 16.6; NT-Pro BNP 95; Platelets 192; Potassium 4.3; Sodium 141    Lipid Panel     Component Value Date/Time   CHOL 196 09/12/2017 1034   TRIG 262 (H) 09/12/2017 1034   HDL 32 (L) 09/12/2017 1034   CHOLHDL 6.1 (H) 09/12/2017 1034   LDLCALC 112 (H) 09/12/2017 1034     Wt Readings from Last 3 Encounters:  10/04/20 188 lb (85.3 kg)  08/04/20 199 lb (90.3 kg)  07/01/20 195 lb (88.5 kg)      Other studies Reviewed: Additional studies/ records that were reviewed today include: TTE 03/03/2020 Review of the above records today demonstrates:   1. Left ventricular ejection fraction, by estimation, is 30 to 35%. The  left ventricle has moderately decreased function. The left ventricle has  no regional wall motion abnormalities. There is moderate left ventricular  hypertrophy. Left ventricular  diastolic parameters are consistent with Grade I diastolic dysfunction  (impaired relaxation).   2. Right ventricular systolic function is normal. The right ventricular  size is normal. There is normal pulmonary artery systolic pressure.   3. The mitral valve is normal in structure. Mild mitral valve  regurgitation. No evidence of mitral stenosis.   4. The aortic valve is normal in structure. Aortic valve regurgitation is  not visualized. No aortic stenosis is present.   5. The inferior vena cava is normal in size with greater than 50%  respiratory variability, suggesting right atrial pressure of 3 mmHg.   Coronary CTA 01/16/2020 1. Moderate Coronary artery disease. CADRADS 3. This study Arletha Marschke be send for FFRct.   2.  Coronary calcium score of 449. This was 28 percentile for age and sex matched control.   3. Normal coronary origin with right dominance.  1. Left Main: 0.97   2. LAD: Proximal 0.90  Mid 0.80 distal 0.79   3. LCX: Proximal 0.70  Mid 0.60 distal not analyzed   4. RCA: Proximal 0.86 Mid 0.79  Distal 0.76   ASSESSMENT AND PLAN:  1.  Chronic systolic heart failure: Currently on optimal medical therapy with Entresto and carvedilol.  Ejection fraction 30 to 35%.  Is now status post Abbott CRT-D implanted 07/01/2020.  His LV lead threshold is significantly elevated today.  He is ECG shows sinus rhythm with a narrow QRS complex and ventricular pacing.  We Camisha Srey get a chest x-ray today to check the positioning of his LV lead.  2.  Hypertension: Currently well controlled  3.  Coronary artery disease: No flow-limiting disease on CT.  Plan per primary cardiology.  Current medicines are reviewed at length with the patient today.   The patient does not have concerns regarding his medicines.  The following changes were made today: None  Labs/ tests ordered today include:  Orders Placed This Encounter  Procedures   DG Chest 2 View   EKG 12-Lead      Disposition:   FU with Reann Dobias 9 months  Signed, Tanush Drees [name], MD  10/04/2020 1:46 PM     [facility] [address] [place] [address] [phone] (office) [phone] (fax)

## 2020-10-04 ENCOUNTER — Other Ambulatory Visit: Payer: Self-pay

## 2020-10-04 ENCOUNTER — Encounter: Payer: Self-pay | Admitting: Cardiology

## 2020-10-04 ENCOUNTER — Ambulatory Visit: Payer: PPO | Admitting: Cardiology

## 2020-10-04 VITALS — BP 130/74 | HR 60 | Ht 70.0 in | Wt 188.0 lb

## 2020-10-04 DIAGNOSIS — I5022 Chronic systolic (congestive) heart failure: Secondary | ICD-10-CM

## 2020-10-04 DIAGNOSIS — T82110A Breakdown (mechanical) of cardiac electrode, initial encounter: Secondary | ICD-10-CM | POA: Diagnosis not present

## 2020-10-04 NOTE — Patient Instructions (Addendum)
Medication Instructions:   Your physician recommends that you continue on your current medications as directed. Please refer to the Current Medication list given to you today.   *If you need a refill on your cardiac medications before your next appointment, please call your pharmacy*   Lab Work: NONE ORDERED  TODAY   If you have labs (blood work) drawn today and your tests are completely normal, you will receive your results only by: MyChart Message (if you have MyChart) OR A paper copy in the mail If you have any lab test that is abnormal or we need to change your treatment, we will call you to review the results.   Testing/Procedures: A chest x-ray takes a picture of the organs and structures inside the chest, including the heart, lungs, and blood vessels. This test can show several things, including, whether the heart is enlarges; whether fluid is building up in the lungs; and whether pacemaker / defibrillator leads are still in place.  [facility]  Go through Main Entrance to Suite A First floor for Chest Xray  Address: [address] [place], [place] [address] Departments: [facility] Phone: [phone]     Follow-Up: At [facility], you and your health needs are our priority.  As part of our continuing mission to provide you with exceptional heart care, we have created designated Provider Care Teams.  These Care Teams include your primary Cardiologist (physician) and Advanced Practice Providers (APPs -  Physician Assistants and Nurse Practitioners) who all work together to provide you with the care you need, when you need it.  We recommend signing up for the patient portal called "MyChart".  Sign up information is provided on this After Visit Summary.  MyChart is used to connect with patients for Virtual Visits (Telemedicine).  Patients are able to view lab/test results, encounter  notes, upcoming appointments, etc.  Non-urgent messages can be sent to your provider as well.   To learn more about what you can do with MyChart, go to [email or web address].    Your next appointment: CHEST XRAY  RESULTS MAY VARY FOLLOW UP  9 month(s)  The format for your next appointment:   In Person  Provider:   Loman Brooklyn, MD   Other Instructions

## 2020-10-05 ENCOUNTER — Ambulatory Visit (HOSPITAL_BASED_OUTPATIENT_CLINIC_OR_DEPARTMENT_OTHER)
Admission: RE | Admit: 2020-10-05 | Discharge: 2020-10-05 | Disposition: A | Discharge status: Home / Self Care | Payer: PPO | Source: Ambulatory Visit | Attending: Cardiology | Admitting: Cardiology

## 2020-10-05 DIAGNOSIS — Z95810 Presence of automatic (implantable) cardiac defibrillator: Secondary | ICD-10-CM | POA: Diagnosis not present

## 2020-10-05 DIAGNOSIS — T82110A Breakdown (mechanical) of cardiac electrode, initial encounter: Secondary | ICD-10-CM | POA: Diagnosis not present

## 2020-10-13 NOTE — Progress Notes (Signed)
Remote ICD transmission.   

## 2020-10-19 ENCOUNTER — Other Ambulatory Visit: Payer: Self-pay

## 2020-10-19 ENCOUNTER — Ambulatory Visit (INDEPENDENT_AMBULATORY_CARE_PROVIDER_SITE_OTHER): Payer: PPO | Admitting: Student

## 2020-10-19 ENCOUNTER — Encounter: Payer: Self-pay | Admitting: Student

## 2020-10-19 VITALS — BP 130/82 | HR 60 | Ht 70.0 in | Wt 199.2 lb

## 2020-10-19 DIAGNOSIS — I25118 Atherosclerotic heart disease of native coronary artery with other forms of angina pectoris: Secondary | ICD-10-CM | POA: Diagnosis not present

## 2020-10-19 DIAGNOSIS — Z950 Presence of cardiac pacemaker: Secondary | ICD-10-CM

## 2020-10-19 DIAGNOSIS — I5022 Chronic systolic (congestive) heart failure: Secondary | ICD-10-CM

## 2020-10-19 LAB — CBC
Hematocrit: 48.1 % (ref 37.5–51.0)
Hemoglobin: 16.4 g/dL (ref 13.0–17.7)
MCH: 29.9 pg (ref 26.6–33.0)
MCHC: 34.1 g/dL (ref 31.5–35.7)
MCV: 88 fL (ref 79–97)
Platelets: 193 10*3/uL (ref 150–450)
RBC: 5.49 x10E6/uL (ref 4.14–5.80)
RDW: 12.8 % (ref 11.6–15.4)
WBC: 8.7 10*3/uL (ref 3.4–10.8)

## 2020-10-19 LAB — CUP PACEART INCLINIC DEVICE CHECK
Battery Remaining Longevity: 98 mo
Brady Statistic RA Percent Paced: 46 %
Brady Statistic RV Percent Paced: 99 %
Date Time Interrogation Session: 20220913112133
HighPow Impedance: 69.75 Ohm
Implantable Lead Implant Date: 20220526
Implantable Lead Implant Date: 20220526
Implantable Lead Implant Date: 20220526
Implantable Lead Location: 753858
Implantable Lead Location: 753859
Implantable Lead Location: 753860
Implantable Pulse Generator Implant Date: 20220526
Lead Channel Impedance Value: 437.5 Ohm
Lead Channel Impedance Value: 437.5 Ohm
Lead Channel Impedance Value: 437.5 Ohm
Lead Channel Pacing Threshold Amplitude: 0.5 V
Lead Channel Pacing Threshold Amplitude: 0.5 V
Lead Channel Pacing Threshold Amplitude: 4.25 V
Lead Channel Pacing Threshold Amplitude: 4.25 V
Lead Channel Pacing Threshold Pulse Width: 0.5 ms
Lead Channel Pacing Threshold Pulse Width: 0.5 ms
Lead Channel Pacing Threshold Pulse Width: 1 ms
Lead Channel Pacing Threshold Pulse Width: 1 ms
Lead Channel Sensing Intrinsic Amplitude: 2.5 mV
Lead Channel Sensing Intrinsic Amplitude: 9.2 mV
Lead Channel Setting Pacing Amplitude: 1.5 V
Lead Channel Setting Pacing Amplitude: 1.5 V
Lead Channel Setting Pacing Pulse Width: 0.5 ms
Lead Channel Setting Sensing Sensitivity: 0.5 mV
Pulse Gen Serial Number: 111044700

## 2020-10-19 LAB — BASIC METABOLIC PANEL
BUN/Creatinine Ratio: 10 (ref 10–24)
BUN: 9 mg/dL (ref 8–27)
CO2: 21 mmol/L (ref 20–29)
Calcium: 9.1 mg/dL (ref 8.6–10.2)
Chloride: 105 mmol/L (ref 96–106)
Creatinine, Ser: 0.89 mg/dL (ref 0.76–1.27)
Glucose: 101 mg/dL — ABNORMAL HIGH (ref 65–99)
Potassium: 4.2 mmol/L (ref 3.5–5.2)
Sodium: 140 mmol/L (ref 134–144)
eGFR: 95 mL/min/{1.73_m2} (ref >59)

## 2020-10-19 NOTE — H&P (View-Only) (Signed)
Electrophysiology Office Note Date: 10/19/2020  ID:  MALAHKI GASAWAY, DOB [birth year], MRN [id number]  PCP: Buckner Malta, MD Primary Cardiologist: None Electrophysiologist: Will [name], MD   CC: Routine ICD follow-up  GRIFFYN KUCINSKI is a 65 y.o. male seen today for Will [name], MD for routine electrophysiology followup for evaluating LV threshold.  Since last being seen in our clinic the patient reports doing OK. He overall feels much better since having his CRT put in.  he denies chest pain, palpitations, dyspnea, PND, orthopnea, nausea, vomiting, dizziness, syncope, edema, weight gain, or early satiety. He has not had ICD shocks.   Device History: St. Jude BiV ICD implanted 06/2020 for Chronic systolic CHF   Past Medical History:  Diagnosis Date   Chronic left systolic heart failure (HCC) 08/02/2017   Hyperlipidemia 08/02/2017   Hypertensive heart disease with heart failure (HCC) 08/02/2017   Kidney stone 08/02/2017   Metabolic syndrome 08/02/2017   Statin intolerance 07/04/2016   Past Surgical History:  Procedure Laterality Date   BACK SURGERY  2007   decrompressed herniated disc   BIV ICD INSERTION CRT-D N/A 07/01/2020   Procedure: BIV ICD INSERTION CRT-D;  Surgeon: Regan Lemming, MD;  Location: [facility] INVASIVE CV LAB;  Service: Cardiovascular;  Laterality: N/A;   FOREARM FRACTURE SURGERY Left 1976   HUMERUS FRACTURE SURGERY Left 1976    Current Outpatient Medications  Medication Sig Dispense Refill   aspirin EC 81 MG tablet Take 81 mg by mouth daily.     carvedilol (COREG) 25 MG tablet Take 25 mg by mouth 2 (two) times daily with a meal.     dapagliflozin propanediol (FARXIGA) 10 MG TABS tablet Take 1 tablet (10 mg total) by mouth daily before breakfast. 90 tablet 3   eplerenone (INSPRA) 25 MG tablet Take 1 tablet (25 mg total) by mouth daily. 90 tablet 3   ezetimibe (ZETIA) 10 MG tablet Take 10 mg by mouth daily.     Multiple Vitamins-Minerals  (MULTIVITAMIN WITH MINERALS) tablet Take 1 tablet by mouth 3 (three) times a week.     pravastatin (PRAVACHOL) 80 MG tablet Take 80 mg by mouth 2 (two) times a week.     sacubitril-valsartan (ENTRESTO) 97-103 MG Take 1 tablet by mouth 2 (two) times daily. 180 tablet 3   No current facility-administered medications for this visit.    Allergies:   Tape   Social History: Social History   Socioeconomic History   Marital status: Married    Spouse name: Not on file   Number of children: Not on file   Years of education: Not on file   Highest education level: Not on file  Occupational History   Not on file  Tobacco Use   Smoking status: Never   Smokeless tobacco: Never  Substance and Sexual Activity   Alcohol use: Yes    Alcohol/week: 7.0 - 14.0 standard drinks    Types: 7 - 14 Cans of beer per week   Drug use: Not on file   Sexual activity: Not on file  Other Topics Concern   Not on file  Social History Narrative   Not on file   Social Determinants of Health   Financial Resource Strain: Not on file  Food Insecurity: Not on file  Transportation Needs: Not on file  Physical Activity: Not on file  Stress: Not on file  Social Connections: Not on file  Intimate Partner Violence: Not on file    Family History:  Family History  Problem Relation Age of Onset   Congestive Heart Failure Mother    Diabetes Mother    Hypercholesterolemia Mother    CVA Mother        x3   Congestive Heart Failure Father    Hypercholesterolemia Maternal Grandmother    Hypercholesterolemia Maternal Grandfather    Prostate cancer Paternal Uncle    Pancreatic cancer Paternal Uncle     Review of Systems: All other systems reviewed and are otherwise negative except as noted above.   Physical Exam: Vitals:   10/19/20 0937  BP: 130/82  Pulse: 60  SpO2: 96%  Weight: 199 lb 3.2 oz (90.4 kg)  Height: 5\' 10"  (1.778 m)     GEN- The patient is well appearing, alert and oriented x 3 today.    HEENT: normocephalic, atraumatic; sclera clear, conjunctiva pink; hearing intact; oropharynx clear; neck supple, no JVP Lymph- no cervical lymphadenopathy Lungs- Clear to ausculation bilaterally, normal work of breathing.  No wheezes, rales, rhonchi Heart- Regular rate and rhythm, no murmurs, rubs or gallops, PMI not laterally displaced GI- soft, non-tender, non-distended, bowel sounds present, no hepatosplenomegaly Extremities- no clubbing or cyanosis. No edema; DP/PT/radial pulses 2+ bilaterally MS- no significant deformity or atrophy Skin- warm and dry, no rash or lesion; ICD pocket well healed Psych- euthymic mood, full affect Neuro- strength and sensation are intact  ICD interrogation- reviewed in detail today,  See PACEART report  EKG:  EKG is ordered today. Personal review of EKG ordered today shows AV dual pacing at 60 bpm, appears to show ineffective LV pacing.  Recent Labs: 06/24/2020: BUN 12; Creatinine, Ser 1.07; Hemoglobin 16.6; NT-Pro BNP 95; Platelets 192; Potassium 4.3; Sodium 141   Wt Readings from Last 3 Encounters:  10/19/20 199 lb 3.2 oz (90.4 kg)  10/04/20 188 lb (85.3 kg)  08/04/20 199 lb (90.3 kg)     Other studies Reviewed: Additional studies/ records that were reviewed today include: Previous EP office notes.   Assessment and Plan:  1.  Chronic systolic dysfunction s/p St. Jude CRT-D  euvolemic today Stable on an appropriate medical regimen See 08/06/20 Art report No changes today Having issues with Arita Miss and insurance. Given patient assistance information.   [name] pulled back by CXR. Pt states he sleeps on his stomach with his left arm raised, and would wake up in pain from doing this soon after his surgery.   Unfortunately, he has no suitable alternate vectors today. P4 has pulled back into main CS body and actually paces the atrium at higher voltages. Alternate vectors have exceedingly high thresholds or PNS. He will be scheduled for lead  revision after discussion with Dr. [place].   Current medicines are reviewed at length with the patient today.     Disposition:   Follow up with Dr. Elberta Fortis as usual post lead revision.  Elberta Fortis, PA-C  10/19/2020 11:30 AM  [facility] [address] [place] [phone] (office) [phone] (fax)

## 2020-10-19 NOTE — Patient Instructions (Signed)
Medication Instructions: Your physician recommends that you continue on your current medications as directed. Please refer to the Current Medication list given to you today.  Labwork: Your physician has recommended that you have lab work today: BMET and CBC  Procedures/Testing: Your physician has recommended that you have a Lead Revision of your device on 11/05/2020. This is a procedure that replaces a Pacemaker ICD generator that is at the end of its service life. The remaining lifespan of a pacemaker is determined during visits to the Device Clinic. The battery in a pacemaker does not stop suddenly but rather loses its charge slowly, which lets the cardiologist plan the replacement date.  Follow-Up: Your physician recommends that you schedule a follow-up appointment in 10 - 14 days from 11/05/2020 with the Device clinic for a wound check  Your physician recommends that you schedule a follow-up appointment in 3 months from 11/05/2020 with Dr. Graciela Husbands.   If you need a refill on your cardiac medications before your next appointment, please call your pharmacy.   -------------------------------------------------------------------------------------------------------------  Please wash with the CHG Soap the night before and morning of procedure (follow instruction page "Preparing For Surgery").   Please report to the Main Entrance [facility] at 11:30am ([address] [place], [place] [address])  DO NOT eat or drink anything after midnight the night before procedure  You may take all of your morning medications the day of your procedure with enough water to get them down safely.  You will need someone to drive you home after the procedure  ----------------------------------------------------------------------------------------------------------- [facility] - Preparing For Surgery  Before surgery, you can play an important role. Because skin is not  sterile, your skin needs to be as free of germs as possible. You can reduce the number of germs on your skin by washing with CHG (chlorahexidine gluconate) Soap before surgery.  CHG is an antiseptic cleaner which kills germs and bonds with the skin to continue killing germs even after washing.   Please do not use if you have an allergy to CHG or antibacterial soaps.  If your skin becomes reddened/irritated stop using the CHG.   Do not shave (including legs and underarms) for at least 48 hours prior to first CHG shower.  It is OK to shave your face.  Please follow these instructions carefully:  1.  Shower the night before surgery and the morning of surgery with CHG.  2.  If you choose to wash your hair, wash your hair first as usual with your normal shampoo.  3.  After you shampoo, rinse your hair and body thoroughly to remove the shampoo.  4.  Use CHG as you would any other liquid soap.  You can apply CHG directly to the skin and wash gently with a clean washcloth. 5.  Apply the CHG Soap to your body ONLY FROM THE NECK DOWN.  Do not use on open wounds or open sores.  Avoid contact with your eyes, ears, mouth and genitals (private parts).  Wash genitals (private parts) with your normal soap.  6.  Wash thoroughly, paying special attention to the area where your surgery will be performed.  7.  Thoroughly rinse your body with warm water from the neck down.   8.  DO NOT shower/wash with your normal soap after using and rinsing off the CHG soap.  9.  Pat yourself dry with a clean towel.           10.  Wear clean pajamas.           11.  Place clean sheets on your bed the night of your first shower and do not sleep with pets.  Day of Surgery: Do not apply any deodorants/lotions.  Please wear clean clothes to the hospital/surgery center.

## 2020-10-19 NOTE — Progress Notes (Signed)
Electrophysiology Office Note Date: 10/19/2020  ID:  Victor Hood, DOB [birth year], MRN [id number]  PCP: Victor Malta, MD Primary Cardiologist: None Electrophysiologist: Victor [name], MD   CC: Routine ICD follow-up  Victor Hood is a 65 y.o. male seen today for Victor [name], MD for routine electrophysiology followup for evaluating LV threshold.  Since last being seen in our clinic the patient reports doing OK. He overall feels much better since having his CRT put in.  he denies chest pain, palpitations, dyspnea, PND, orthopnea, nausea, vomiting, dizziness, syncope, edema, weight gain, or early satiety. He has not had ICD shocks.   Device History: St. Jude BiV ICD implanted 06/2020 for Chronic systolic CHF   Past Medical History:  Diagnosis Date   Chronic left systolic heart failure (HCC) 08/02/2017   Hyperlipidemia 08/02/2017   Hypertensive heart disease with heart failure (HCC) 08/02/2017   Kidney stone 08/02/2017   Metabolic syndrome 08/02/2017   Statin intolerance 07/04/2016   Past Surgical History:  Procedure Laterality Date   BACK SURGERY  2007   decrompressed herniated disc   BIV ICD INSERTION CRT-D N/A 07/01/2020   Procedure: BIV ICD INSERTION CRT-D;  Surgeon: Victor Lemming, MD;  Location: [facility] INVASIVE CV LAB;  Service: Cardiovascular;  Laterality: N/A;   FOREARM FRACTURE SURGERY Left 1976   HUMERUS FRACTURE SURGERY Left 1976    Current Outpatient Medications  Medication Sig Dispense Refill   aspirin EC 81 MG tablet Take 81 mg by mouth daily.     carvedilol (COREG) 25 MG tablet Take 25 mg by mouth 2 (two) times daily with a meal.     dapagliflozin propanediol (FARXIGA) 10 MG TABS tablet Take 1 tablet (10 mg total) by mouth daily before breakfast. 90 tablet 3   eplerenone (INSPRA) 25 MG tablet Take 1 tablet (25 mg total) by mouth daily. 90 tablet 3   ezetimibe (ZETIA) 10 MG tablet Take 10 mg by mouth daily.     Multiple Vitamins-Minerals  (MULTIVITAMIN WITH MINERALS) tablet Take 1 tablet by mouth 3 (three) times a week.     pravastatin (PRAVACHOL) 80 MG tablet Take 80 mg by mouth 2 (two) times a week.     sacubitril-valsartan (ENTRESTO) 97-103 MG Take 1 tablet by mouth 2 (two) times daily. 180 tablet 3   No current facility-administered medications for this visit.    Allergies:   Tape   Social History: Social History   Socioeconomic History   Marital status: Married    Spouse name: Not on file   Number of children: Not on file   Years of education: Not on file   Highest education level: Not on file  Occupational History   Not on file  Tobacco Use   Smoking status: Never   Smokeless tobacco: Never  Substance and Sexual Activity   Alcohol use: Yes    Alcohol/week: 7.0 - 14.0 standard drinks    Types: 7 - 14 Cans of beer per week   Drug use: Not on file   Sexual activity: Not on file  Other Topics Concern   Not on file  Social History Narrative   Not on file   Social Determinants of Health   Financial Resource Strain: Not on file  Food Insecurity: Not on file  Transportation Needs: Not on file  Physical Activity: Not on file  Stress: Not on file  Social Connections: Not on file  Intimate Partner Violence: Not on file    Family History:  Family History  Problem Relation Age of Onset   Congestive Heart Failure Mother    Diabetes Mother    Hypercholesterolemia Mother    CVA Mother        x3   Congestive Heart Failure Father    Hypercholesterolemia Maternal Grandmother    Hypercholesterolemia Maternal Grandfather    Prostate cancer Paternal Uncle    Pancreatic cancer Paternal Uncle     Review of Systems: All other systems reviewed and are otherwise negative except as noted above.   Physical Exam: Vitals:   10/19/20 0937  BP: 130/82  Pulse: 60  SpO2: 96%  Weight: 199 lb 3.2 oz (90.4 kg)  Height: 5\' 10"  (1.778 m)     GEN- The patient is well appearing, alert and oriented x 3 today.    HEENT: normocephalic, atraumatic; sclera clear, conjunctiva pink; hearing intact; oropharynx clear; neck supple, no JVP Lymph- no cervical lymphadenopathy Lungs- Clear to ausculation bilaterally, normal work of breathing.  No wheezes, rales, rhonchi Heart- Regular rate and rhythm, no murmurs, rubs or gallops, PMI not laterally displaced GI- soft, non-tender, non-distended, bowel sounds present, no hepatosplenomegaly Extremities- no clubbing or cyanosis. No edema; DP/PT/radial pulses 2+ bilaterally MS- no significant deformity or atrophy Skin- warm and dry, no rash or lesion; ICD pocket well healed Psych- euthymic mood, full affect Neuro- strength and sensation are intact  ICD interrogation- reviewed in detail today,  See PACEART report  EKG:  EKG is ordered today. Personal review of EKG ordered today shows AV dual pacing at 60 bpm, appears to show ineffective LV pacing.  Recent Labs: 06/24/2020: BUN 12; Creatinine, Ser 1.07; Hemoglobin 16.6; NT-Pro BNP 95; Platelets 192; Potassium 4.3; Sodium 141   Wt Readings from Last 3 Encounters:  10/19/20 199 lb 3.2 oz (90.4 kg)  10/04/20 188 lb (85.3 kg)  08/04/20 199 lb (90.3 kg)     Other studies Reviewed: Additional studies/ records that were reviewed today include: Previous EP office notes.   Assessment and Plan:  1.  Chronic systolic dysfunction s/p St. Jude CRT-D  euvolemic today Stable on an appropriate medical regimen See 08/06/20 Art report No changes today Having issues with Arita Hood and insurance. Given patient assistance information.   [name] pulled back by CXR. Pt states he sleeps on his stomach with his left arm raised, and would wake up in pain from doing this soon after his surgery.   Unfortunately, he has no suitable alternate vectors today. P4 has pulled back into main CS body and actually paces the atrium at higher voltages. Alternate vectors have exceedingly high thresholds or PNS. He Victor be scheduled for lead  revision after discussion with Dr. [place].   Current medicines are reviewed at length with the patient today.     Disposition:   Follow up with Dr. Elberta Hood as usual post lead revision.  Victor Fortis, PA-C  10/19/2020 11:30 AM  [facility] [address] [place] [phone] (office) [phone] (fax)

## 2020-11-04 ENCOUNTER — Other Ambulatory Visit: Payer: PPO

## 2020-11-04 NOTE — Pre-Procedure Instructions (Signed)
Spoke with patient's wife, Clydie Braun.  She reviewed patient's letter instructions with me.    Arrival time 1130 Nothing to eat or drink after midnight No meds AM of procedure Responsible person to drive you home and stay with you for 24 hrs Wash with special soap night before and morning of procedure

## 2020-11-05 ENCOUNTER — Ambulatory Visit (HOSPITAL_COMMUNITY)
Admission: RE | Admit: 2020-11-05 | Discharge: 2020-11-05 | Disposition: A | Discharge status: Home / Self Care | Payer: PPO | Source: Ambulatory Visit | Attending: Cardiology | Admitting: Cardiology

## 2020-11-05 ENCOUNTER — Other Ambulatory Visit: Payer: Self-pay

## 2020-11-05 ENCOUNTER — Ambulatory Visit (HOSPITAL_COMMUNITY): Payer: PPO

## 2020-11-05 ENCOUNTER — Ambulatory Visit (HOSPITAL_COMMUNITY)
Admission: RE | Disposition: A | Discharge status: Home / Self Care | Payer: Self-pay | Source: Ambulatory Visit | Attending: Cardiology

## 2020-11-05 DIAGNOSIS — Z8249 Family history of ischemic heart disease and other diseases of the circulatory system: Secondary | ICD-10-CM | POA: Insufficient documentation

## 2020-11-05 DIAGNOSIS — Z888 Allergy status to other drugs, medicaments and biological substances status: Secondary | ICD-10-CM | POA: Insufficient documentation

## 2020-11-05 DIAGNOSIS — I447 Left bundle-branch block, unspecified: Secondary | ICD-10-CM | POA: Insufficient documentation

## 2020-11-05 DIAGNOSIS — Z95818 Presence of other cardiac implants and grafts: Secondary | ICD-10-CM

## 2020-11-05 DIAGNOSIS — Z7984 Long term (current) use of oral hypoglycemic drugs: Secondary | ICD-10-CM | POA: Diagnosis not present

## 2020-11-05 DIAGNOSIS — I110 Hypertensive heart disease with heart failure: Secondary | ICD-10-CM | POA: Insufficient documentation

## 2020-11-05 DIAGNOSIS — I5022 Chronic systolic (congestive) heart failure: Secondary | ICD-10-CM | POA: Diagnosis not present

## 2020-11-05 DIAGNOSIS — Z95810 Presence of automatic (implantable) cardiac defibrillator: Secondary | ICD-10-CM | POA: Insufficient documentation

## 2020-11-05 DIAGNOSIS — Y848 Other medical procedures as the cause of abnormal reaction of the patient, or of later complication, without mention of misadventure at the time of the procedure: Secondary | ICD-10-CM | POA: Insufficient documentation

## 2020-11-05 DIAGNOSIS — Z9889 Other specified postprocedural states: Secondary | ICD-10-CM | POA: Diagnosis not present

## 2020-11-05 DIAGNOSIS — I428 Other cardiomyopathies: Secondary | ICD-10-CM | POA: Insufficient documentation

## 2020-11-05 DIAGNOSIS — Z7982 Long term (current) use of aspirin: Secondary | ICD-10-CM | POA: Diagnosis not present

## 2020-11-05 DIAGNOSIS — Z79899 Other long term (current) drug therapy: Secondary | ICD-10-CM | POA: Insufficient documentation

## 2020-11-05 DIAGNOSIS — T82120A Displacement of cardiac electrode, initial encounter: Secondary | ICD-10-CM | POA: Insufficient documentation

## 2020-11-05 HISTORY — PX: LEAD REVISION/REPAIR: EP1213

## 2020-11-05 SURGERY — LEAD REVISION/REPAIR
Anesthesia: LOCAL

## 2020-11-05 MED ORDER — CEFAZOLIN SODIUM-DEXTROSE 2-4 GM/100ML-% IV SOLN
INTRAVENOUS | Status: AC
  Filled 2020-11-05: qty 100

## 2020-11-05 MED ORDER — CHLORHEXIDINE GLUCONATE 4 % EX LIQD
4.0000 "application " | Freq: Once | CUTANEOUS | Status: DC
  Filled 2020-11-05: qty 60

## 2020-11-05 MED ORDER — LIDOCAINE HCL 1 % IJ SOLN
INTRAMUSCULAR | Status: AC
  Filled 2020-11-05: qty 20

## 2020-11-05 MED ORDER — ACETAMINOPHEN 325 MG PO TABS
325.0000 mg | ORAL_TABLET | ORAL | Status: DC | PRN
  Filled 2020-11-05: qty 2

## 2020-11-05 MED ORDER — LIDOCAINE HCL 1 % IJ SOLN
INTRAMUSCULAR | Status: AC
  Filled 2020-11-05: qty 60

## 2020-11-05 MED ORDER — CEFAZOLIN SODIUM-DEXTROSE 2-4 GM/100ML-% IV SOLN
2.0000 g | INTRAVENOUS | Status: AC
  Administered 2020-11-05: 2 g via INTRAVENOUS

## 2020-11-05 MED ORDER — SODIUM CHLORIDE 0.9 % IV SOLN
80.0000 mg | INTRAVENOUS | Status: AC
  Administered 2020-11-05: 80 mg

## 2020-11-05 MED ORDER — FENTANYL CITRATE (PF) 100 MCG/2ML IJ SOLN
INTRAMUSCULAR | Status: DC | PRN
  Administered 2020-11-05 (×3): 25 ug via INTRAVENOUS

## 2020-11-05 MED ORDER — MIDAZOLAM HCL 5 MG/5ML IJ SOLN
INTRAMUSCULAR | Status: AC
  Filled 2020-11-05: qty 5

## 2020-11-05 MED ORDER — FENTANYL CITRATE (PF) 100 MCG/2ML IJ SOLN
INTRAMUSCULAR | Status: AC
  Filled 2020-11-05: qty 2

## 2020-11-05 MED ORDER — MIDAZOLAM HCL 5 MG/5ML IJ SOLN
INTRAMUSCULAR | Status: DC | PRN
  Administered 2020-11-05 (×3): 1 mg via INTRAVENOUS

## 2020-11-05 MED ORDER — CEFAZOLIN SODIUM-DEXTROSE 1-4 GM/50ML-% IV SOLN
1.0000 g | Freq: Four times a day (QID) | INTRAVENOUS | Status: DC
  Filled 2020-11-05: qty 50

## 2020-11-05 MED ORDER — SODIUM CHLORIDE 0.9 % IV SOLN: INTRAVENOUS | Status: DC

## 2020-11-05 MED ORDER — HEPARIN (PORCINE) IN NACL 1000-0.9 UT/500ML-% IV SOLN
INTRAVENOUS | Status: DC | PRN
  Administered 2020-11-05: 500 mL

## 2020-11-05 MED ORDER — SODIUM CHLORIDE 0.9 % IV SOLN
INTRAVENOUS | Status: AC
  Filled 2020-11-05: qty 2

## 2020-11-05 MED ORDER — LIDOCAINE HCL (PF) 1 % IJ SOLN
INTRAMUSCULAR | Status: DC | PRN
  Administered 2020-11-05: 20 mL
  Administered 2020-11-05: 60 mL

## 2020-11-05 MED ORDER — ONDANSETRON HCL 4 MG/2ML IJ SOLN: 4.0000 mg | Freq: Four times a day (QID) | INTRAMUSCULAR | Status: DC | PRN

## 2020-11-05 SURGICAL SUPPLY — 18 items
BALLN COR SINUS VENO 6FR 80 (BALLOONS) ×2
BALLOON COR SINUS VENO 6FR 80 (BALLOONS) ×1 IMPLANT
CABLE SURGICAL S-101-97-12 (CABLE) ×2 IMPLANT
CATH CPS DIRECT 135 DS2C020 (CATHETERS) ×2 IMPLANT
CATH CPS QUART CN DS2N029-65 (CATHETERS) ×2 IMPLANT
KIT MICROPUNCTURE NIT STIFF (SHEATH) ×2 IMPLANT
LEAD QUARTET 1458QL-86 (Lead) ×1 IMPLANT
MAT PREVALON FULL STRYKER (MISCELLANEOUS) ×2 IMPLANT
PAD PRO RADIOLUCENT 2001M-C (PAD) ×2 IMPLANT
POUCH AIGIS-R ANTIBACT ICD (Mesh General) IMPLANT
QUARTET 1458QL-86 (Lead) ×2 IMPLANT
SHEATH 9.5FR PRELUDE SNAP 13 (SHEATH) ×2 IMPLANT
SHEATH PROBE COVER 6X72 (BAG) ×2 IMPLANT
TRAY PACEMAKER INSERTION (PACKS) ×2 IMPLANT
WIRE ACUITY WHISPER EDS 4648 (WIRE) ×2 IMPLANT
WIRE GUIDERIGHT .032X150 (WIRE) ×2 IMPLANT
WIRE HI TORQ VERSACORE-J 145CM (WIRE) ×2 IMPLANT
WIRE MAILMAN 182CM (WIRE) ×2 IMPLANT

## 2020-11-05 NOTE — Discharge Instructions (Signed)
    Supplemental Discharge Instructions for  Pacemaker/Defibrillator Patients  Tomorrow, 11/06/20, send in a device transmission  Activity No heavy lifting or vigorous activity with your left/right arm for 6 to 8 weeks.  Do not raise your left/right arm above your head for one week.  Gradually raise your affected arm as drawn below.             11/10/20                    11/11/20                     11/12/20                 11/13/20 __  NO DRIVING until cleared to at your wound check visit.  WOUND CARE Keep the wound area clean and dry.  Do not get this area wet , no showers until cleared to at your wound check visit. Tomorrow, 11/06/20, remove the arm sling Tomorrow, 11/06/20 remove the LARGE outer plastic bandage.  Underneath the plastic bandage there are steri strips (paper tapes), DO NOT remove these. The tape/steri-strips on your wound will fall off; do not pull them off.  No bandage is needed on the site.  DO  NOT apply any creams, oils, or ointments to the wound area. If you notice any drainage or discharge from the wound, any swelling or bruising at the site, or you develop a fever > 101? F after you are discharged home, call the office at once.  Special Instructions You are still able to use cellular telephones; use the ear opposite the side where you have your pacemaker/defibrillator.  Avoid carrying your cellular phone near your device. When traveling through airports, show security personnel your identification card to avoid being screened in the metal detectors.  Ask the security personnel to use the hand wand. Avoid arc welding equipment, MRI testing (magnetic resonance imaging), TENS units (transcutaneous nerve stimulators).  Call the office for questions about other devices. Avoid electrical appliances that are in poor condition or are not properly grounded. Microwave ovens are safe to be near or to operate.  Additional information for defibrillator patients should your device  go off: If your device goes off ONCE and you feel fine afterward, notify the device clinic nurses. If your device goes off ONCE and you do not feel well afterward, call 911. If your device goes off TWICE, call 911. If your device goes off THREE times in one day, call 911.  DO NOT DRIVE YOURSELF OR A FAMILY MEMBER WITH A DEFIBRILLATOR TO THE HOSPITAL--CALL 911.

## 2020-11-05 NOTE — Interval H&P Note (Signed)
History and Physical Interval Note:  11/05/2020 1:03 PM  Victor Hood  has presented today for surgery, with the diagnosis of lead revision.  The various methods of treatment have been discussed with the patient and family. After consideration of risks, benefits and other options for treatment, the patient has consented to  Procedure(s): LEAD REVISION/REPAIR (N/A) as a surgical intervention.  The patient's history has been reviewed, patient examined, no change in status, stable for surgery.  I have reviewed the patient's chart and labs.  Questions were answered to the patient's satisfaction.     Jacquetta Polhamus [employer]

## 2020-11-08 ENCOUNTER — Encounter (HOSPITAL_COMMUNITY): Payer: Self-pay | Admitting: Cardiology

## 2020-11-08 MED FILL — Lidocaine HCl Local Inj 1%: INTRAMUSCULAR | Qty: 80 | Status: AC

## 2020-11-12 ENCOUNTER — Encounter (HOSPITAL_COMMUNITY): Payer: Self-pay | Admitting: Cardiology

## 2020-11-17 ENCOUNTER — Ambulatory Visit (INDEPENDENT_AMBULATORY_CARE_PROVIDER_SITE_OTHER): Payer: PPO

## 2020-11-17 ENCOUNTER — Other Ambulatory Visit: Payer: Self-pay

## 2020-11-17 DIAGNOSIS — I5022 Chronic systolic (congestive) heart failure: Secondary | ICD-10-CM

## 2020-11-17 LAB — CUP PACEART INCLINIC DEVICE CHECK
Battery Remaining Longevity: 79 mo
Brady Statistic RA Percent Paced: 5.1 %
Brady Statistic RV Percent Paced: 99.06 %
Date Time Interrogation Session: 20221012113150
HighPow Impedance: 64.125
Implantable Lead Implant Date: 20220526
Implantable Lead Implant Date: 20220526
Implantable Lead Implant Date: 20220930
Implantable Lead Location: 753858
Implantable Lead Location: 753859
Implantable Lead Location: 753860
Implantable Pulse Generator Implant Date: 20220526
Lead Channel Impedance Value: 425 Ohm
Lead Channel Impedance Value: 437.5 Ohm
Lead Channel Impedance Value: 787.5 Ohm
Lead Channel Pacing Threshold Amplitude: 0.5 V
Lead Channel Pacing Threshold Amplitude: 0.5 V
Lead Channel Pacing Threshold Amplitude: 0.75 V
Lead Channel Pacing Threshold Amplitude: 0.75 V
Lead Channel Pacing Threshold Pulse Width: 0.5 ms
Lead Channel Pacing Threshold Pulse Width: 0.5 ms
Lead Channel Pacing Threshold Pulse Width: 0.5 ms
Lead Channel Pacing Threshold Pulse Width: 0.5 ms
Lead Channel Sensing Intrinsic Amplitude: 11 mV
Lead Channel Sensing Intrinsic Amplitude: 2.8 mV
Lead Channel Setting Pacing Amplitude: 1.5 V
Lead Channel Setting Pacing Amplitude: 1.5 V
Lead Channel Setting Pacing Amplitude: 3.5 V
Lead Channel Setting Pacing Pulse Width: 0.5 ms
Lead Channel Setting Pacing Pulse Width: 0.5 ms
Lead Channel Setting Sensing Sensitivity: 0.5 mV
Pulse Gen Serial Number: 111044700

## 2020-11-17 NOTE — Progress Notes (Signed)
Wound check appointment. Steri-strips removed. Wound without redness or edema. Incision edges approximated, wound well healed. Normal device function. Thresholds, sensing, and impedances consistent with implant measurements. Device programmed at 3.5V for extra safety margin until 3 month visit. BVP >99%.  Histogram distribution appropriate for patient and level of activity. No mode switches or ventricular arrhythmias noted. Patient educated about wound care, arm mobility, lifting restrictions, shock plan. ROV in 3 months with Dr. Elberta Fortis.

## 2020-11-17 NOTE — Patient Instructions (Signed)

## 2021-01-18 ENCOUNTER — Other Ambulatory Visit: Payer: PPO

## 2021-01-25 ENCOUNTER — Telehealth: Payer: Self-pay | Admitting: Cardiology

## 2021-01-25 MED ORDER — EPLERENONE 25 MG PO TABS: 25.0000 mg | ORAL_TABLET | Freq: Every day | ORAL | 3 refills | Status: DC

## 2021-01-25 MED ORDER — CARVEDILOL 25 MG PO TABS: 25.0000 mg | ORAL_TABLET | Freq: Two times a day (BID) | ORAL | 3 refills | Status: DC

## 2021-01-25 NOTE — Telephone Encounter (Signed)
°*  STAT* If patient is at the pharmacy, call can be transferred to refill team.   1. Which medications need to be refilled? (please list name of each medication and dose if known) carvedilol (COREG) 25 MG tablet eplerenone (INSPRA) 25 MG tablet  2. Which pharmacy/location (including street and city if local pharmacy) is medication to be sent to?[facility] - [place], [place] - [address]  3. Do they need a 30 day or 90 day supply? 90 ds

## 2021-01-25 NOTE — Telephone Encounter (Signed)
Refill sent in per request.  

## 2021-02-02 ENCOUNTER — Other Ambulatory Visit: Payer: Self-pay

## 2021-02-02 ENCOUNTER — Ambulatory Visit (INDEPENDENT_AMBULATORY_CARE_PROVIDER_SITE_OTHER): Payer: PPO

## 2021-02-02 DIAGNOSIS — E782 Mixed hyperlipidemia: Secondary | ICD-10-CM | POA: Diagnosis not present

## 2021-02-02 DIAGNOSIS — I5022 Chronic systolic (congestive) heart failure: Secondary | ICD-10-CM | POA: Diagnosis not present

## 2021-02-02 DIAGNOSIS — I447 Left bundle-branch block, unspecified: Secondary | ICD-10-CM

## 2021-02-02 DIAGNOSIS — I110 Hypertensive heart disease with heart failure: Secondary | ICD-10-CM | POA: Diagnosis not present

## 2021-02-02 DIAGNOSIS — Z789 Other specified health status: Secondary | ICD-10-CM | POA: Diagnosis not present

## 2021-02-02 DIAGNOSIS — Z950 Presence of cardiac pacemaker: Secondary | ICD-10-CM | POA: Diagnosis not present

## 2021-02-02 DIAGNOSIS — I25118 Atherosclerotic heart disease of native coronary artery with other forms of angina pectoris: Secondary | ICD-10-CM | POA: Diagnosis not present

## 2021-02-02 LAB — ECHOCARDIOGRAM COMPLETE
Area-P 1/2: 4.21 cm2
Calc EF: 41.8 %
S' Lateral: 3.2 cm
Single Plane A2C EF: 40.8 %
Single Plane A4C EF: 43.4 %

## 2021-02-04 ENCOUNTER — Ambulatory Visit (INDEPENDENT_AMBULATORY_CARE_PROVIDER_SITE_OTHER): Payer: PPO

## 2021-02-04 DIAGNOSIS — Z950 Presence of cardiac pacemaker: Secondary | ICD-10-CM

## 2021-02-04 LAB — CUP PACEART REMOTE DEVICE CHECK
Battery Remaining Longevity: 80 mo
Battery Remaining Percentage: 89 %
Battery Voltage: 2.98 V
Brady Statistic AP VP Percent: 4.1 %
Brady Statistic AP VS Percent: 1 %
Brady Statistic AS VP Percent: 95 %
Brady Statistic AS VS Percent: 1 %
Brady Statistic RA Percent Paced: 4.7 %
Date Time Interrogation Session: 20221230135607
HighPow Impedance: 69 Ohm
Implantable Lead Implant Date: 20220526
Implantable Lead Implant Date: 20220526
Implantable Lead Implant Date: 20220930
Implantable Lead Location: 753858
Implantable Lead Location: 753859
Implantable Lead Location: 753860
Implantable Pulse Generator Implant Date: 20220526
Lead Channel Impedance Value: 400 Ohm
Lead Channel Impedance Value: 410 Ohm
Lead Channel Impedance Value: 830 Ohm
Lead Channel Pacing Threshold Amplitude: 0.5 V
Lead Channel Pacing Threshold Amplitude: 0.5 V
Lead Channel Pacing Threshold Amplitude: 0.75 V
Lead Channel Pacing Threshold Pulse Width: 0.5 ms
Lead Channel Pacing Threshold Pulse Width: 0.5 ms
Lead Channel Pacing Threshold Pulse Width: 0.5 ms
Lead Channel Sensing Intrinsic Amplitude: 1.9 mV
Lead Channel Sensing Intrinsic Amplitude: 9.7 mV
Lead Channel Setting Pacing Amplitude: 1.5 V
Lead Channel Setting Pacing Amplitude: 1.5 V
Lead Channel Setting Pacing Amplitude: 3.5 V
Lead Channel Setting Pacing Pulse Width: 0.5 ms
Lead Channel Setting Pacing Pulse Width: 0.5 ms
Lead Channel Setting Sensing Sensitivity: 0.5 mV
Pulse Gen Serial Number: 111044700

## 2021-02-13 NOTE — Progress Notes (Signed)
Electrophysiology Office Note   Date:  02/14/2021   ID:  Victor Hood, DOB [birth year], MRN [id number]  PCP:  Victor Malta, MD  Cardiologist:  Victor Hood Primary Electrophysiologist:  Osias Resnick Victor Loa, MD    Chief Complaint: CHF   History of Present Illness: Victor Hood is a 66 y.o. male who is being seen today for the evaluation of CHF at the request of Victor Malta, MD. Presenting today for electrophysiology evaluation.  He has a history significant for chronic systolic heart failure, hypertension, hyperlipidemia.  Echo 03/04/2019 showed an ejection fraction of 30 to 35%.  Echo 1 year later showed a persistently low ejection fraction.  He is status post Abbott CRT-D implanted 07/01/2020.  Unfortunately, his LV lead dislodged and he is now status post lead revision 11/05/2020.  Today, denies symptoms of palpitations, chest pain, shortness of breath, orthopnea, PND, lower extremity edema, claudication, dizziness, presyncope, syncope, bleeding, or neurologic sequela. The patient is tolerating medications without difficulties.  Since being seen he has done well.  He has had no issues since his lead revision.  He is feeling much improved and has more energy and less shortness of breath.   Past Medical History:  Diagnosis Date   Chronic left systolic heart failure (HCC) 08/02/2017   Hyperlipidemia 08/02/2017   Hypertensive heart disease with heart failure (HCC) 08/02/2017   Kidney stone 08/02/2017   Metabolic syndrome 08/02/2017   Statin intolerance 07/04/2016   Past Surgical History:  Procedure Laterality Date   BACK SURGERY  2007   decrompressed herniated disc   BIV ICD INSERTION CRT-D N/A 07/01/2020   Procedure: BIV ICD INSERTION CRT-D;  Surgeon: Regan Lemming, MD;  Location: [facility];  Service: Cardiovascular;  Laterality: N/A;   FOREARM FRACTURE SURGERY Left 1976   HUMERUS FRACTURE SURGERY Left 1976   LEAD REVISION/REPAIR N/A 11/05/2020   Procedure: LEAD  REVISION/REPAIR;  Surgeon: Regan Lemming, MD;  Location: [facility];  Service: Cardiovascular;  Laterality: N/A;     Current Outpatient Medications  Medication Sig Dispense Refill   aspirin EC 81 MG tablet Take 81 mg by mouth daily.     carvedilol (COREG) 25 MG tablet Take 1 tablet (25 mg total) by mouth 2 (two) times daily with a meal. 180 tablet 3   dapagliflozin propanediol (FARXIGA) 10 MG TABS tablet Take 1 tablet (10 mg total) by mouth daily before breakfast. 90 tablet 3   eplerenone (INSPRA) 25 MG tablet Take 1 tablet (25 mg total) by mouth daily. 90 tablet 3   ezetimibe (ZETIA) 10 MG tablet Take 10 mg by mouth daily.     Multiple Vitamins-Minerals (MULTIVITAMIN WITH MINERALS) tablet Take 1 tablet by mouth 3 (three) times a week.     pravastatin (PRAVACHOL) 80 MG tablet Take 80 mg by mouth 2 (two) times a week.     sacubitril-valsartan (ENTRESTO) 97-103 MG Take 1 tablet by mouth 2 (two) times daily. 180 tablet 3   No current facility-administered medications for this visit.    Allergies:   Tape   Social History:  The patient  reports that he has never smoked. He has never used smokeless tobacco. He reports current alcohol use of about 7.0 - 14.0 standard drinks per week.   Family History:  The patient's family history includes CVA in his mother; Congestive Heart Failure in his father and mother; Diabetes in his mother; Hypercholesterolemia in his maternal grandfather, maternal grandmother, and mother; Pancreatic cancer in his  paternal uncle; Prostate cancer in his paternal uncle.   ROS:  Please see the history of present illness.   Otherwise, review of systems is positive for none.   All other systems are reviewed and negative.   PHYSICAL EXAM: VS:  BP (!) 146/80    Pulse (!) 55    Ht 5\' 10"  (1.778 m)    Wt 201 lb 3.2 oz (91.3 kg)    SpO2 97%    BMI 28.87 kg/m  , BMI Body mass index is 28.87 kg/m. GEN: Well nourished, well developed, in no acute distress  HEENT:  normal  Neck: no JVD, carotid bruits, or masses Cardiac: RRR; no murmurs, rubs, or gallops,no edema  Respiratory:  clear to auscultation bilaterally, normal work of breathing GI: soft, nontender, nondistended, + BS MS: no deformity or atrophy  Skin: warm and dry, device site well healed Neuro:  Strength and sensation are intact Psych: euthymic mood, full affect  EKG:  EKG is ordered today. Personal review of the ekg ordered shows A sense, V pace  Personal review of the device interrogation today. Results in [facility]   Recent Labs: 06/24/2020: NT-Pro BNP 95 10/19/2020: BUN 9; Creatinine, Ser 0.89; Hemoglobin 16.4; Platelets 193; Potassium 4.2; Sodium 140    Lipid Panel     Component Value Date/Time   CHOL 196 09/12/2017 1034   TRIG 262 (H) 09/12/2017 1034   HDL 32 (L) 09/12/2017 1034   CHOLHDL 6.1 (H) 09/12/2017 1034   LDLCALC 112 (H) 09/12/2017 1034     Wt Readings from Last 3 Encounters:  02/14/21 201 lb 3.2 oz (91.3 kg)  11/05/20 195 lb (88.5 kg)  10/19/20 199 lb 3.2 oz (90.4 kg)      Other studies Reviewed: Additional studies/ records that were reviewed today include: TTE 03/03/2020 Review of the above records today demonstrates:   1. Left ventricular ejection fraction, by estimation, is 30 to 35%. The  left ventricle has moderately decreased function. The left ventricle has  no regional wall motion abnormalities. There is moderate left ventricular  hypertrophy. Left ventricular  diastolic parameters are consistent with Grade I diastolic dysfunction  (impaired relaxation).   2. Right ventricular systolic function is normal. The right ventricular  size is normal. There is normal pulmonary artery systolic pressure.   3. The mitral valve is normal in structure. Mild mitral valve  regurgitation. No evidence of mitral stenosis.   4. The aortic valve is normal in structure. Aortic valve regurgitation is  not visualized. No aortic stenosis is present.   5. The inferior  vena cava is normal in size with greater than 50%  respiratory variability, suggesting right atrial pressure of 3 mmHg.   Coronary CTA 01/16/2020 1. Moderate Coronary artery disease. CADRADS 3. This study Dereona Kolodny be send for FFRct.   2. Coronary calcium score of 449. This was 34 percentile for age and sex matched control.   3. Normal coronary origin with right dominance.  1. Left Main: 0.97   2. LAD: Proximal 0.90  Mid 0.80 distal 0.79   3. LCX: Proximal 0.70  Mid 0.60 distal not analyzed   4. RCA: Proximal 0.86 Mid 0.79  Distal 0.76   ASSESSMENT AND PLAN:  1.  Chronic systolic heart failure: Currently on optimal medical therapy with Entresto and carvedilol.  Ejection fraction 30 to 35%.  Status post Abbott CRT-D implanted 07/01/2020.  His LV lead threshold was significantly elevated.  He is now status post lead revision 11/05/2020.  Device functioning  appropriately.  No changes at this time.  2.  Hypertension: Mildly elevated today.  Usually well controlled.  No changes.  3.  Coronary artery disease: Not flow-limiting on coronary CT.  Plan per primary cardiology.  Current medicines are reviewed at length with the patient today.   The patient does not have concerns regarding his medicines.  The following changes were made today: None  Labs/ tests ordered today include:  Orders Placed This Encounter  Procedures   EKG 12-Lead      Disposition:   FU with Philisha Weinel 9 months  Signed, Socorro Kanitz Victor Loa, MD  02/14/2021 11:03 AM     [facility] [address] [place] [address] [phone] (office) [phone] (fax)

## 2021-02-14 ENCOUNTER — Encounter: Payer: Self-pay | Admitting: Cardiology

## 2021-02-14 ENCOUNTER — Ambulatory Visit: Payer: PPO | Admitting: Cardiology

## 2021-02-14 ENCOUNTER — Other Ambulatory Visit: Payer: Self-pay

## 2021-02-14 VITALS — BP 146/80 | HR 55 | Ht 70.0 in | Wt 201.2 lb

## 2021-02-14 DIAGNOSIS — I5022 Chronic systolic (congestive) heart failure: Secondary | ICD-10-CM

## 2021-02-16 NOTE — Progress Notes (Signed)
Remote ICD transmission.   

## 2021-02-23 ENCOUNTER — Encounter: Payer: Self-pay | Admitting: Cardiology

## 2021-02-23 ENCOUNTER — Other Ambulatory Visit: Payer: Self-pay

## 2021-02-23 ENCOUNTER — Ambulatory Visit: Payer: PPO | Admitting: Cardiology

## 2021-02-23 VITALS — BP 154/76 | HR 66 | Ht 70.0 in | Wt 199.8 lb

## 2021-02-23 DIAGNOSIS — Z950 Presence of cardiac pacemaker: Secondary | ICD-10-CM | POA: Diagnosis not present

## 2021-02-23 DIAGNOSIS — E782 Mixed hyperlipidemia: Secondary | ICD-10-CM | POA: Diagnosis not present

## 2021-02-23 DIAGNOSIS — I447 Left bundle-branch block, unspecified: Secondary | ICD-10-CM

## 2021-02-23 DIAGNOSIS — I110 Hypertensive heart disease with heart failure: Secondary | ICD-10-CM

## 2021-02-23 DIAGNOSIS — I5022 Chronic systolic (congestive) heart failure: Secondary | ICD-10-CM

## 2021-02-23 DIAGNOSIS — I25118 Atherosclerotic heart disease of native coronary artery with other forms of angina pectoris: Secondary | ICD-10-CM | POA: Diagnosis not present

## 2021-02-23 NOTE — Patient Instructions (Signed)

## 2021-02-23 NOTE — Progress Notes (Signed)
Cardiology Office Note:    Date:  02/23/2021   ID:  Victor Hood, DOB 1955-08-24, MRN [id number]  PCP:  Serita Grammes, MD  Cardiologist:  Shirlee More, MD    Referring MD: Serita Grammes, MD    ASSESSMENT:    1. Chronic systolic (congestive) heart failure ([place])   2. Left bundle branch block   3. Biventricular cardiac pacemaker in situ   4. Coronary artery disease of native artery of native heart with stable angina pectoris ([place])   5. Hypertensive heart disease with heart failure ([place])   6. Mixed hyperlipidemia    PLAN:    In order of problems listed above:  [name] continues to do well with his cardiomyopathy heart failure with guideline directed treatment and CRT is EF is normalized and he is asymptomatic New York Heart Association class I.  He continues to follow in our device clinic.  Continue treatment including optimal dose carvedilol and Entresto and at this time is not taking his MRA. Stable CAD New York Heart Association class I continue aspirin and statin Stable hypertension   Next appointment: 6 months   Medication Adjustments/Labs and Tests Ordered: Current medicines are reviewed at length with the patient today.  Concerns regarding medicines are outlined above.  No orders of the defined types were placed in this encounter.  No orders of the defined types were placed in this encounter.   Chief Complaint  Patient presents with   Follow-up   Congestive Heart Failure    History of Present Illness:    Victor Hood is a 66 y.o. male with a hx of chronic systolic heart failure EF 30 to 35% with left bundle branch block and prolonged QRS duration hypertensive heart disease hyperlipidemia and CAD with 25% right coronary artery and left main less than 25% LAD 25% and left circumflex marginal 50 to 69%.  He has had CRT-D.  He was last seen 08/04/2020.  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  He has echocardiogram 02/02/2021 showed a profound improvement in EF 60 to 65% moderate LVH normal right ventricular size function and pulmonary artery pressure Past Medical History:  Diagnosis Date   Chronic left systolic heart failure ([place]) 08/02/2017   Hyperlipidemia 08/02/2017   Hypertensive heart disease with heart failure ([place]) 08/02/2017   Kidney stone 123456   Metabolic syndrome 123456   Statin intolerance 07/04/2016    Past Surgical History:  Procedure Laterality Date   BACK SURGERY  2007   decrompressed herniated disc   BIV ICD INSERTION CRT-D N/A 07/01/2020   Procedure: BIV ICD INSERTION CRT-D;  Surgeon: Constance Haw, MD;  Location: [facility];  Service: Cardiovascular;  Laterality: N/A;   FOREARM FRACTURE SURGERY Left 1976   HUMERUS FRACTURE SURGERY Left 1976   LEAD REVISION/REPAIR N/A 11/05/2020   Procedure: LEAD REVISION/REPAIR;  Surgeon: Constance Haw, MD;  Location: [facility];  Service: Cardiovascular;  Laterality: N/A;    Current Medications: Current Meds  Medication Sig   aspirin EC 81 MG tablet Take 81 mg by mouth daily.   carvedilol (COREG) 25 MG tablet Take 1 tablet (25 mg total) by mouth 2 (two) times daily with a meal.   dapagliflozin propanediol (FARXIGA) 10 MG TABS tablet  Take 1 tablet (10 mg total) by mouth daily before breakfast.   ezetimibe (ZETIA) 10 MG tablet Take 10 mg by mouth daily.   Multiple Vitamins-Minerals (MULTIVITAMIN WITH MINERALS) tablet Take 1 tablet by mouth 3 (three) times a week.   pravastatin (PRAVACHOL) 80 MG tablet Take 80 mg by mouth 2 (two) times a week.   sacubitril-valsartan (ENTRESTO) 97-103 MG Take 1 tablet by mouth 2 (two) times daily.     Allergies:   Tape   Social History    Socioeconomic History   Marital status: Married    Spouse name: Not on file   Number of children: Not on file   Years of education: Not on file   Highest education level: Not on file  Occupational History   Not on file  Tobacco Use   Smoking status: Never    Passive exposure: Never   Smokeless tobacco: Never  Vaping Use   Vaping Use: Never used  Substance and Sexual Activity   Alcohol use: Yes    Alcohol/week: 7.0 - 14.0 standard drinks    Types: 7 - 14 Cans of beer per week   Drug use: Never   Sexual activity: Not on file  Other Topics Concern   Not on file  Social History Narrative   Not on file   Social Determinants of Health   Financial Resource Strain: Not on file  Food Insecurity: Not on file  Transportation Needs: Not on file  Physical Activity: Not on file  Stress: Not on file  Social Connections: Not on file     Family History: The patient's family history includes CVA in his mother; Congestive Heart Failure in his father and mother; Diabetes in his mother; Hypercholesterolemia in his maternal grandfather, maternal grandmother, and mother; Pancreatic cancer in his paternal uncle; Prostate cancer in his paternal uncle. ROS:   Please see the history of present illness.    All other systems reviewed and are negative.  EKGs/Labs/Other Studies Reviewed:    The following studies were reviewed today: Additional studies/ records that were reviewed today include: TTE 03/03/2020 Review of the above records today demonstrates:   1. Left ventricular ejection fraction, by estimation, is 30 to 35%. The  left ventricle has moderately decreased function. The left ventricle has  no regional wall motion abnormalities. There is moderate left ventricular  hypertrophy. Left ventricular  diastolic parameters are consistent with Grade I diastolic dysfunction  (impaired relaxation).   2. Right ventricular systolic function is normal. The right ventricular  size is normal.  There is normal pulmonary artery systolic pressure.   3. The mitral valve is normal in structure. Mild mitral valve  regurgitation. No evidence of mitral stenosis.   4. The aortic valve is normal in structure. Aortic valve regurgitation is  not visualized. No aortic stenosis is present.   5. The inferior vena cava is normal in size with greater than 50%  respiratory variability, suggesting right atrial pressure of 3 mmHg.    Coronary CTA 01/16/2020 1. Moderate Coronary artery disease. CADRADS 3. This study will be send for FFRct.   2. Coronary calcium score of 449. This was 30 percentile for age and sex matched control.   3. Normal coronary origin with right dominance.   1. Left Main: 0.97   2. LAD: Proximal 0.90  Mid 0.80 distal 0.79   3. LCX: Proximal 0.70  Mid 0.60 distal not analyzed   4. RCA: Proximal 0.86 Mid 0.79  Distal 0.76   EKG:  EKG performed at 02/14/2021 shows dual-chamber paced CRT rhythm  Recent Labs: 06/24/2020: NT-Pro BNP 95 10/19/2020: BUN 9; Creatinine, Ser 0.89; Hemoglobin 16.4; Platelets 193; Potassium 4.2; Sodium 140  Recent Lipid Panel    Component Value Date/Time   CHOL 196 09/12/2017 1034   TRIG 262 (H) 09/12/2017 1034   HDL 32 (L) 09/12/2017 1034   CHOLHDL 6.1 (H) 09/12/2017 1034   LDLCALC 112 (H) 09/12/2017 1034    Physical Exam:    VS:  BP (!) 154/76 (BP Location: Left Arm)    Pulse 66    Ht 5\' 10"  (1.778 m)    Wt 199 lb 12.8 oz (90.6 kg)    SpO2 97%    BMI 28.67 kg/m     Wt Readings from Last 3 Encounters:  02/23/21 199 lb 12.8 oz (90.6 kg)  02/14/21 201 lb 3.2 oz (91.3 kg)  11/05/20 195 lb (88.5 kg)     GEN:  Well nourished, well developed in no acute distress HEENT: Normal NECK: No JVD; No carotid bruits LYMPHATICS: No lymphadenopathy CARDIAC: RRR, no murmurs, rubs, gallops RESPIRATORY:  Clear to auscultation without rales, wheezing or rhonchi  ABDOMEN: Soft, non-tender, non-distended MUSCULOSKELETAL:  No edema; No deformity   SKIN: Warm and dry NEUROLOGIC:  Alert and oriented x 3 PSYCHIATRIC:  Normal affect    Signed, Shirlee More, MD  02/23/2021 2:48 PM    [place]

## 2021-02-24 ENCOUNTER — Telehealth: Payer: Self-pay

## 2021-02-24 LAB — LIPID PANEL
Chol/HDL Ratio: 6 ratio — ABNORMAL HIGH (ref 0.0–5.0)
Cholesterol, Total: 199 mg/dL (ref 100–199)
HDL: 33 mg/dL — ABNORMAL LOW (ref >39)
LDL Chol Calc (NIH): 112 mg/dL — ABNORMAL HIGH (ref 0–99)
Triglycerides: 311 mg/dL — ABNORMAL HIGH (ref 0–149)
VLDL Cholesterol Cal: 54 mg/dL — ABNORMAL HIGH (ref 5–40)

## 2021-02-24 LAB — COMPREHENSIVE METABOLIC PANEL
ALT: 14 IU/L (ref 0–44)
AST: 18 IU/L (ref 0–40)
Albumin/Globulin Ratio: 1.9 (ref 1.2–2.2)
Albumin: 4.5 g/dL (ref 3.8–4.8)
Alkaline Phosphatase: 78 IU/L (ref 44–121)
BUN/Creatinine Ratio: 8 — ABNORMAL LOW (ref 10–24)
BUN: 8 mg/dL (ref 8–27)
Bilirubin Total: 0.3 mg/dL (ref 0.0–1.2)
CO2: 23 mmol/L (ref 20–29)
Calcium: 9.7 mg/dL (ref 8.6–10.2)
Chloride: 104 mmol/L (ref 96–106)
Creatinine, Ser: 0.99 mg/dL (ref 0.76–1.27)
Globulin, Total: 2.4 g/dL (ref 1.5–4.5)
Glucose: 87 mg/dL (ref 70–99)
Potassium: 4.5 mmol/L (ref 3.5–5.2)
Sodium: 141 mmol/L (ref 134–144)
Total Protein: 6.9 g/dL (ref 6.0–8.5)
eGFR: 84 mL/min/{1.73_m2} (ref >59)

## 2021-02-24 NOTE — Telephone Encounter (Signed)
-----   Message from Richardo Priest, MD sent at 02/24/2021  6:27 AM EST ----- Regarding: FW: Non fasting  No changes in treatment ----- Message ----- From: Lavone Neri Lab Results In Sent: 02/24/2021   5:38 AM EST To: Richardo Priest, MD

## 2021-02-24 NOTE — Telephone Encounter (Signed)
Spoke with patient regarding results and recommendation.  Patient verbalizes understanding and is agreeable to plan of care. Advised patient to call back with any issues or concerns.  

## 2021-03-01 ENCOUNTER — Other Ambulatory Visit: Payer: Self-pay | Admitting: Cardiology

## 2021-04-08 ENCOUNTER — Other Ambulatory Visit: Payer: Self-pay | Admitting: Cardiology

## 2021-05-02 ENCOUNTER — Other Ambulatory Visit: Payer: Self-pay | Admitting: Cardiology

## 2021-05-06 ENCOUNTER — Ambulatory Visit (INDEPENDENT_AMBULATORY_CARE_PROVIDER_SITE_OTHER): Payer: PPO

## 2021-05-06 DIAGNOSIS — I110 Hypertensive heart disease with heart failure: Secondary | ICD-10-CM

## 2021-05-06 LAB — CUP PACEART REMOTE DEVICE CHECK
Battery Remaining Longevity: 88 mo
Battery Remaining Percentage: 86 %
Battery Voltage: 2.98 V
Brady Statistic AP VP Percent: 5.2 %
Brady Statistic AP VS Percent: 1.1 %
Brady Statistic AS VP Percent: 94 %
Brady Statistic AS VS Percent: 1 %
Brady Statistic RA Percent Paced: 6 %
Date Time Interrogation Session: 20230331101413
HighPow Impedance: 72 Ohm
Implantable Lead Implant Date: 20220526
Implantable Lead Implant Date: 20220526
Implantable Lead Implant Date: 20220930
Implantable Lead Location: 753858
Implantable Lead Location: 753859
Implantable Lead Location: 753860
Implantable Pulse Generator Implant Date: 20220526
Lead Channel Impedance Value: 400 Ohm
Lead Channel Impedance Value: 440 Ohm
Lead Channel Impedance Value: 880 Ohm
Lead Channel Pacing Threshold Amplitude: 0.625 V
Lead Channel Pacing Threshold Amplitude: 0.625 V
Lead Channel Pacing Threshold Amplitude: 1 V
Lead Channel Pacing Threshold Pulse Width: 0.5 ms
Lead Channel Pacing Threshold Pulse Width: 0.5 ms
Lead Channel Pacing Threshold Pulse Width: 0.5 ms
Lead Channel Sensing Intrinsic Amplitude: 3.2 mV
Lead Channel Sensing Intrinsic Amplitude: 9.6 mV
Lead Channel Setting Pacing Amplitude: 1.625
Lead Channel Setting Pacing Amplitude: 1.625
Lead Channel Setting Pacing Amplitude: 2.5 V
Lead Channel Setting Pacing Pulse Width: 0.5 ms
Lead Channel Setting Pacing Pulse Width: 0.5 ms
Lead Channel Setting Sensing Sensitivity: 0.5 mV
Pulse Gen Serial Number: 111044700

## 2021-05-18 NOTE — Progress Notes (Signed)
Remote ICD transmission.   

## 2021-06-04 ENCOUNTER — Other Ambulatory Visit: Payer: Self-pay | Admitting: Cardiology

## 2021-08-05 ENCOUNTER — Ambulatory Visit (INDEPENDENT_AMBULATORY_CARE_PROVIDER_SITE_OTHER): Payer: PPO

## 2021-08-05 DIAGNOSIS — I447 Left bundle-branch block, unspecified: Secondary | ICD-10-CM

## 2021-08-05 DIAGNOSIS — I5022 Chronic systolic (congestive) heart failure: Secondary | ICD-10-CM | POA: Diagnosis not present

## 2021-08-05 LAB — CUP PACEART REMOTE DEVICE CHECK
Battery Remaining Longevity: 85 mo
Battery Remaining Percentage: 83 %
Battery Voltage: 2.98 V
Brady Statistic AP VP Percent: 6.2 %
Brady Statistic AP VS Percent: 1.1 %
Brady Statistic AS VP Percent: 93 %
Brady Statistic AS VS Percent: 1 %
Brady Statistic RA Percent Paced: 7 %
Date Time Interrogation Session: 20230630021558
HighPow Impedance: 68 Ohm
Implantable Lead Implant Date: 20220526
Implantable Lead Implant Date: 20220526
Implantable Lead Implant Date: 20220930
Implantable Lead Location: 753858
Implantable Lead Location: 753859
Implantable Lead Location: 753860
Implantable Pulse Generator Implant Date: 20220526
Lead Channel Impedance Value: 390 Ohm
Lead Channel Impedance Value: 410 Ohm
Lead Channel Impedance Value: 850 Ohm
Lead Channel Pacing Threshold Amplitude: 0.5 V
Lead Channel Pacing Threshold Amplitude: 0.625 V
Lead Channel Pacing Threshold Amplitude: 1 V
Lead Channel Pacing Threshold Pulse Width: 0.5 ms
Lead Channel Pacing Threshold Pulse Width: 0.5 ms
Lead Channel Pacing Threshold Pulse Width: 0.5 ms
Lead Channel Sensing Intrinsic Amplitude: 2.9 mV
Lead Channel Sensing Intrinsic Amplitude: 8.4 mV
Lead Channel Setting Pacing Amplitude: 1.5 V
Lead Channel Setting Pacing Amplitude: 1.625
Lead Channel Setting Pacing Amplitude: 2.5 V
Lead Channel Setting Pacing Pulse Width: 0.5 ms
Lead Channel Setting Pacing Pulse Width: 0.5 ms
Lead Channel Setting Sensing Sensitivity: 0.5 mV
Pulse Gen Serial Number: 111044700

## 2021-08-17 NOTE — Progress Notes (Signed)
Remote ICD transmission.   

## 2021-08-22 NOTE — Progress Notes (Signed)
Cardiology Office Note:    Date:  08/23/2021   ID:  Victor Hood, DOB 18-Apr-1955, MRN [id number]  PCP:  Serita Grammes, MD  Cardiologist:  Shirlee More, MD    Referring MD: Serita Grammes, MD    ASSESSMENT:    1. Chronic systolic (congestive) heart failure ([place])   2. Left bundle branch block   3. Biventricular cardiac pacemaker in situ   4. Hypertensive heart disease with heart failure ([place])   5. Mixed hyperlipidemia    PLAN:    In order of problems listed above:  [name] is doing well his ejection fraction is normalized no fluid overload does not require loop diuretic we will continue guideline directed treatment beta-blocker Entresto MRA and if able to access alternative SGLT2 inhibitor will use it recheck labs renal function proBNP Marked improvement normalization of function with CRT continue to follow in device clinic Blood pressure well controlled continue his guideline directed treatment Continue his statin which is resulted in optimal lipids and we will recheck his lipids today   Next appointment: 6 months   Medication Adjustments/Labs and Tests Ordered: Current medicines are reviewed at length with the patient today.  Concerns regarding medicines are outlined above.  No orders of the defined types were placed in this encounter.  No orders of the defined types were placed in this encounter.   Chief Complaint  Patient presents with   Follow-up   Congestive Heart Failure    History of Present Illness:    Victor Hood is a 66 y.o. male with a hx of chronic systolic heart failure EF 30 to 35% with left bundle branch block and prolonged QRS duration and subsequent cardiac resynchronization therapy with normalization of his ejection fraction hypertensive heart disease hyperlipidemia and CAD with 25% right coronary artery and left main less than 25% LAD 25% and left circumflex marginal 50 to 69%  last seen 02/23/2021.  His echocardiogram 02/02/2021 showed a  profound improvement in EF 60 to 65% moderate LVH normal right ventricular size function and pulmonary artery pressure   Compliance with diet, lifestyle and medications: Yes  He is pleased with the result of his echocardiogram normalization of severe left ventricular dysfunction with guideline directed therapy and CRT Unfortunately cannot access SGLT2 inhibitor with his benefits, we discussed recently approved SGLT2 Brenzavvy (bexagliflozin hopefully be able to access this at a cost basis about $45 a month He is not having edema shortness of breath orthopnea chest pain palpitation or syncope He tolerates his statin without muscle pain or weakness Past Medical History:  Diagnosis Date   Chronic left systolic heart failure ([place]) 08/02/2017   Hyperlipidemia 08/02/2017   Hypertensive heart disease with heart failure ([place]) 08/02/2017   Kidney stone 123456   Metabolic syndrome 123456   Statin intolerance 07/04/2016    Past Surgical History:  Procedure Laterality Date   BACK SURGERY  2007   decrompressed herniated disc   BIV ICD INSERTION CRT-D N/A 07/01/2020   Procedure: BIV ICD INSERTION CRT-D;  Surgeon: Constance Haw, MD;  Location: [place] [facility];  Service: Cardiovascular;  Laterality: N/A;   FOREARM FRACTURE SURGERY Left 1976   HUMERUS FRACTURE SURGERY Left 1976   LEAD REVISION/REPAIR N/A 11/05/2020   Procedure: LEAD REVISION/REPAIR;  Surgeon: Constance Haw, MD;  Location: [facility];  Service: Cardiovascular;  Laterality: N/A;    Current Medications: Current Meds  Medication Sig   aspirin EC 81 MG tablet Take 81 mg by mouth daily.  carvedilol (COREG) 25 MG tablet Take 1 tablet (25 mg total) by mouth 2 (two) times daily with a meal.   ENTRESTO 97-103 MG Take 1 tablet by mouth twice daily   eplerenone (INSPRA) 25 MG tablet Take 1 tablet by mouth once daily   ezetimibe (ZETIA) 10 MG tablet Take 1 tablet by mouth once daily   Multiple Vitamins-Minerals  (MULTIVITAMIN WITH MINERALS) tablet Take 1 tablet by mouth 3 (three) times a week.   pravastatin (PRAVACHOL) 80 MG tablet Take 80 mg by mouth 2 (two) times a week.     Allergies:   Tape   Social History   Socioeconomic History   Marital status: Married    Spouse name: Not on file   Number of children: Not on file   Years of education: Not on file   Highest education level: Not on file  Occupational History   Not on file  Tobacco Use   Smoking status: Never    Passive exposure: Never   Smokeless tobacco: Never  Vaping Use   Vaping Use: Never used  Substance and Sexual Activity   Alcohol use: Yes    Alcohol/week: 7.0 - 14.0 standard drinks of alcohol    Types: 7 - 14 Cans of beer per week   Drug use: Never   Sexual activity: Not on file  Other Topics Concern   Not on file  Social History Narrative   Not on file   Social Determinants of Health   Financial Resource Strain: Not on file  Food Insecurity: Not on file  Transportation Needs: Not on file  Physical Activity: Not on file  Stress: Not on file  Social Connections: Not on file     Family History: The patient's family history includes CVA in his mother; Congestive Heart Failure in his father and mother; Diabetes in his mother; Hypercholesterolemia in his maternal grandfather, maternal grandmother, and mother; Pancreatic cancer in his paternal uncle; Prostate cancer in his paternal uncle. ROS:   Please see the history of present illness.    All other systems reviewed and are negative.  EKGs/Labs/Other Studies Reviewed:    The following studies were reviewed today:  His last pacemaker download 08/05/2021 shows normal device and lead function he is ventricularly paced 99 percent  Recent Labs: 10/19/2020: Hemoglobin 16.4; Platelets 193 02/23/2021: ALT 14; BUN 8; Creatinine, Ser 0.99; Potassium 4.5; Sodium 141  Recent Lipid Panel    Component Value Date/Time   CHOL 199 02/23/2021 1453   TRIG 311 (H) 02/23/2021  1453   HDL 33 (L) 02/23/2021 1453   CHOLHDL 6.0 (H) 02/23/2021 1453   LDLCALC 112 (H) 02/23/2021 1453    Physical Exam:    VS:  BP 140/84 (BP Location: Left Arm, Patient Position: Sitting, Cuff Size: Normal)   Pulse 60   Ht 5\' 10"  (1.778 m)   Wt 193 lb (87.5 kg)   SpO2 95%   BMI 27.69 kg/m     Wt Readings from Last 3 Encounters:  08/23/21 193 lb (87.5 kg)  02/23/21 199 lb 12.8 oz (90.6 kg)  02/14/21 201 lb 3.2 oz (91.3 kg)     GEN:  Well nourished, well developed in no acute distress HEENT: Normal NECK: No JVD; No carotid bruits LYMPHATICS: No lymphadenopathy CARDIAC: RRR, no murmurs, rubs, gallops RESPIRATORY:  Clear to auscultation without rales, wheezing or rhonchi  ABDOMEN: Soft, non-tender, non-distended MUSCULOSKELETAL:  No edema; No deformity  SKIN: Warm and dry NEUROLOGIC:  Alert and oriented x 3  PSYCHIATRIC:  Normal affect    Signed, Norman Herrlich, MD  08/23/2021 2:31 PM    [facility]

## 2021-08-23 ENCOUNTER — Ambulatory Visit: Payer: PPO | Admitting: Cardiology

## 2021-08-23 ENCOUNTER — Encounter: Payer: Self-pay | Admitting: Cardiology

## 2021-08-23 VITALS — BP 140/84 | HR 60 | Ht 70.0 in | Wt 193.0 lb

## 2021-08-23 DIAGNOSIS — E782 Mixed hyperlipidemia: Secondary | ICD-10-CM | POA: Diagnosis not present

## 2021-08-23 DIAGNOSIS — I110 Hypertensive heart disease with heart failure: Secondary | ICD-10-CM | POA: Diagnosis not present

## 2021-08-23 DIAGNOSIS — I5022 Chronic systolic (congestive) heart failure: Secondary | ICD-10-CM

## 2021-08-23 DIAGNOSIS — Z950 Presence of cardiac pacemaker: Secondary | ICD-10-CM

## 2021-08-23 DIAGNOSIS — I447 Left bundle-branch block, unspecified: Secondary | ICD-10-CM

## 2021-08-23 NOTE — Patient Instructions (Signed)
Medication Instructions:  Your physician recommends that you continue on your current medications as directed. Please refer to the Current Medication list given to you today.  *If you need a refill on your cardiac medications before your next appointment, please call your pharmacy*   Lab Work: Your physician recommends that you return for lab work in:   Labs today: CMP, Lipids, Pro BNP  If you have labs (blood work) drawn today and your tests are completely normal, you will receive your results only by: MyChart Message (if you have MyChart) OR A paper copy in the mail If you have any lab test that is abnormal or we need to change your treatment, we will call you to review the results.   Testing/Procedures: None   Follow-Up: At [facility], you and your health needs are our priority.  As part of our continuing mission to provide you with exceptional heart care, we have created designated Provider Care Teams.  These Care Teams include your primary Cardiologist (physician) and Advanced Practice Providers (APPs -  Physician Assistants and Nurse Practitioners) who all work together to provide you with the care you need, when you need it.  We recommend signing up for the patient portal called "MyChart".  Sign up information is provided on this After Visit Summary.  MyChart is used to connect with patients for Virtual Visits (Telemedicine).  Patients are able to view lab/test results, encounter notes, upcoming appointments, etc.  Non-urgent messages can be sent to your provider as well.   To learn more about what you can do with MyChart, go to [email or web address].    Your next appointment:   6 month(s)  The format for your next appointment:   In Person  Provider:   Norman Herrlich, MD    Other Instructions None  Important Information About Sugar

## 2021-08-24 ENCOUNTER — Telehealth: Payer: Self-pay | Admitting: Cardiology

## 2021-08-24 LAB — LIPID PANEL
Chol/HDL Ratio: 5.2 ratio — ABNORMAL HIGH (ref 0.0–5.0)
Cholesterol, Total: 170 mg/dL (ref 100–199)
HDL: 33 mg/dL — ABNORMAL LOW (ref >39)
LDL Chol Calc (NIH): 95 mg/dL (ref 0–99)
Triglycerides: 250 mg/dL — ABNORMAL HIGH (ref 0–149)
VLDL Cholesterol Cal: 42 mg/dL — ABNORMAL HIGH (ref 5–40)

## 2021-08-24 LAB — COMPREHENSIVE METABOLIC PANEL
ALT: 8 IU/L (ref 0–44)
AST: 11 IU/L (ref 0–40)
Albumin/Globulin Ratio: 1.6 (ref 1.2–2.2)
Albumin: 4.1 g/dL (ref 3.9–4.9)
Alkaline Phosphatase: 81 IU/L (ref 44–121)
BUN/Creatinine Ratio: 6 — ABNORMAL LOW (ref 10–24)
BUN: 8 mg/dL (ref 8–27)
Bilirubin Total: 0.6 mg/dL (ref 0.0–1.2)
CO2: 22 mmol/L (ref 20–29)
Calcium: 9.1 mg/dL (ref 8.6–10.2)
Chloride: 103 mmol/L (ref 96–106)
Creatinine, Ser: 1.31 mg/dL — ABNORMAL HIGH (ref 0.76–1.27)
Globulin, Total: 2.5 g/dL (ref 1.5–4.5)
Glucose: 104 mg/dL — ABNORMAL HIGH (ref 70–99)
Potassium: 4.2 mmol/L (ref 3.5–5.2)
Sodium: 140 mmol/L (ref 134–144)
Total Protein: 6.6 g/dL (ref 6.0–8.5)
eGFR: 60 mL/min/{1.73_m2} (ref >59)

## 2021-08-24 LAB — PRO B NATRIURETIC PEPTIDE: NT-Pro BNP: 184 pg/mL (ref 0–376)

## 2021-08-24 NOTE — Telephone Encounter (Signed)
Patient returned RN's call regarding results.  Tried to reach RN but did not get a response.

## 2021-08-24 NOTE — Telephone Encounter (Signed)
Patient informed of results.  

## 2021-10-18 IMAGING — CR DG CHEST 2V
2 series · 2 of 2 positions shown · non-contrast
Comparison: 10/05/2020

CLINICAL DATA: Status post pacemaker revision

EXAM:
CHEST - 2 VIEW

[w chest pa]
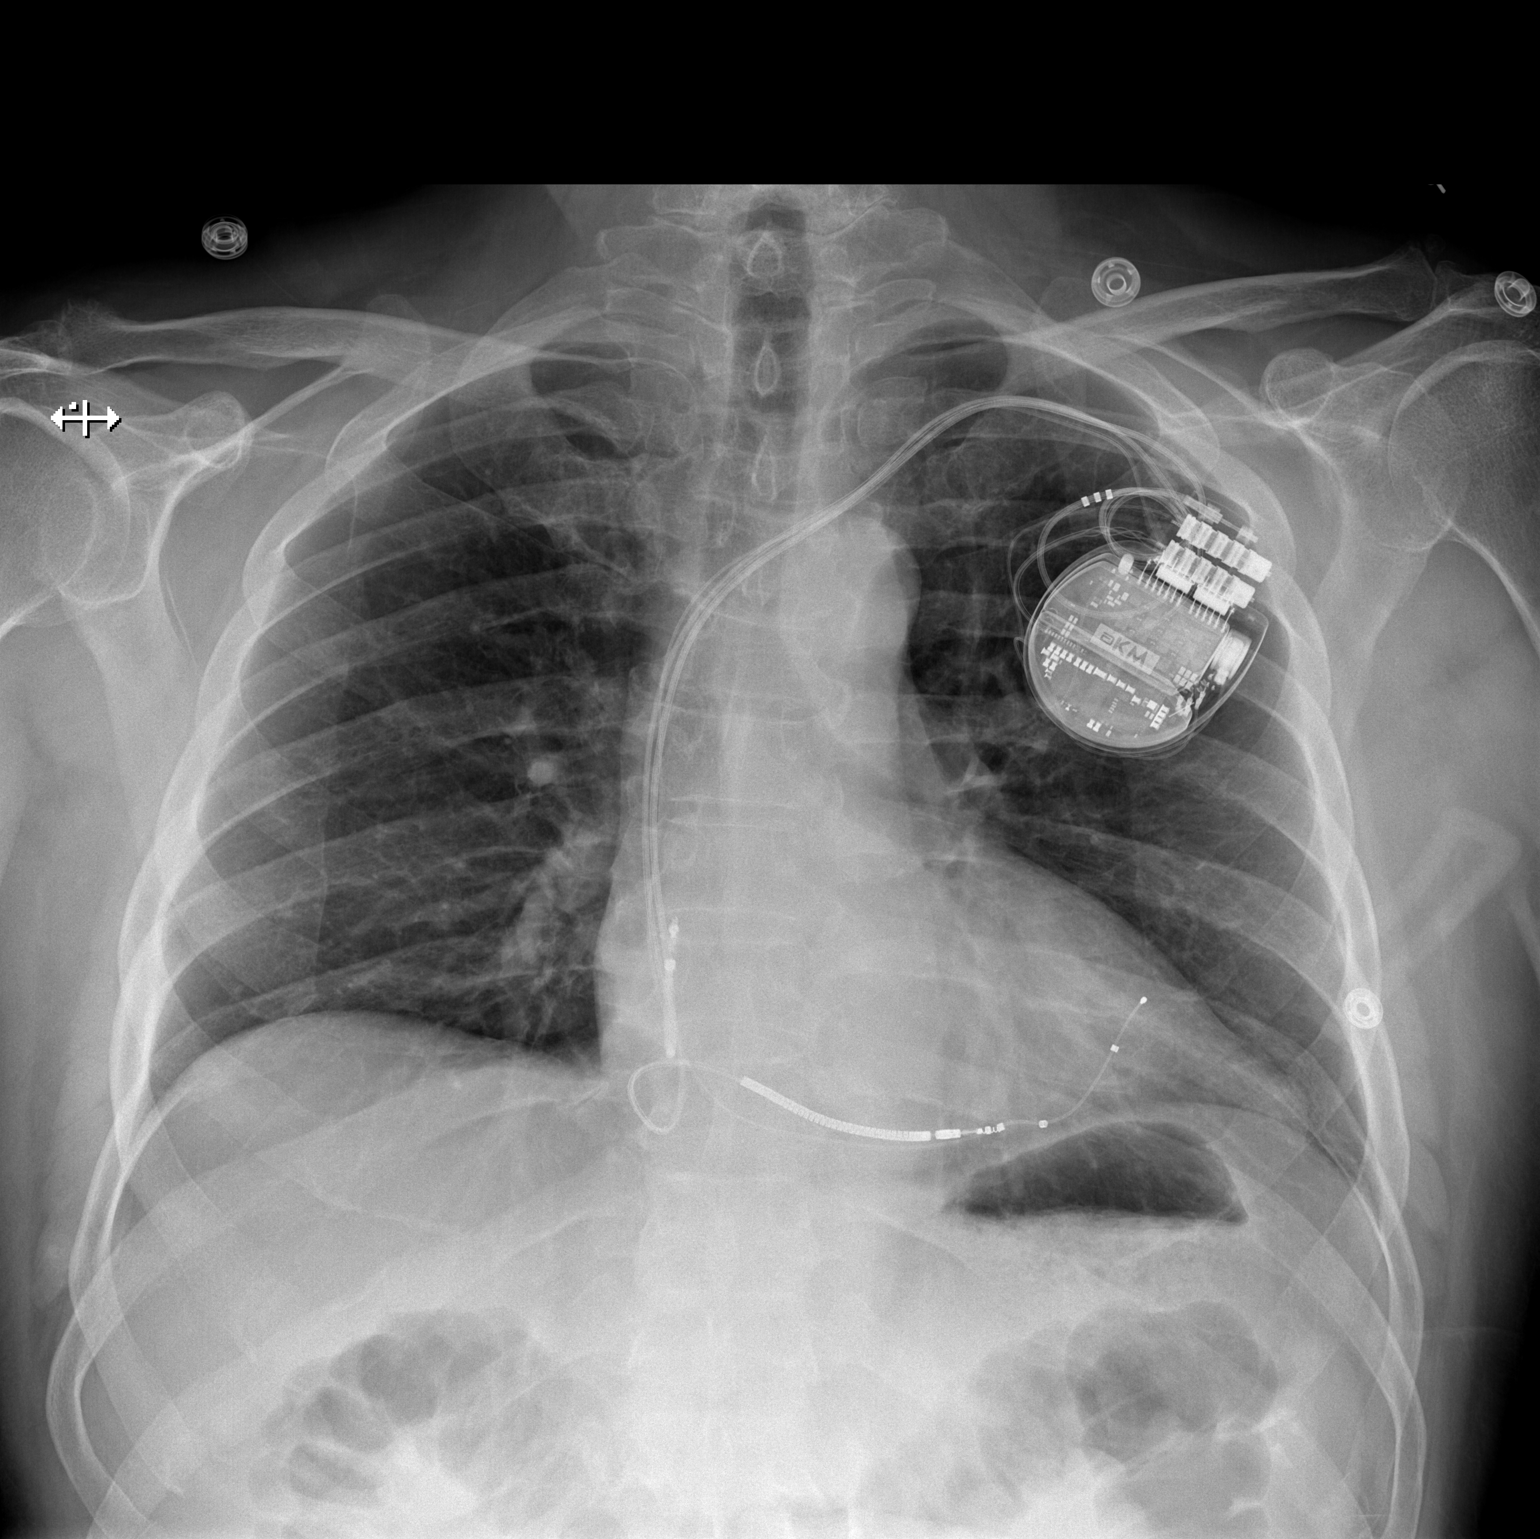

[w chest lat]
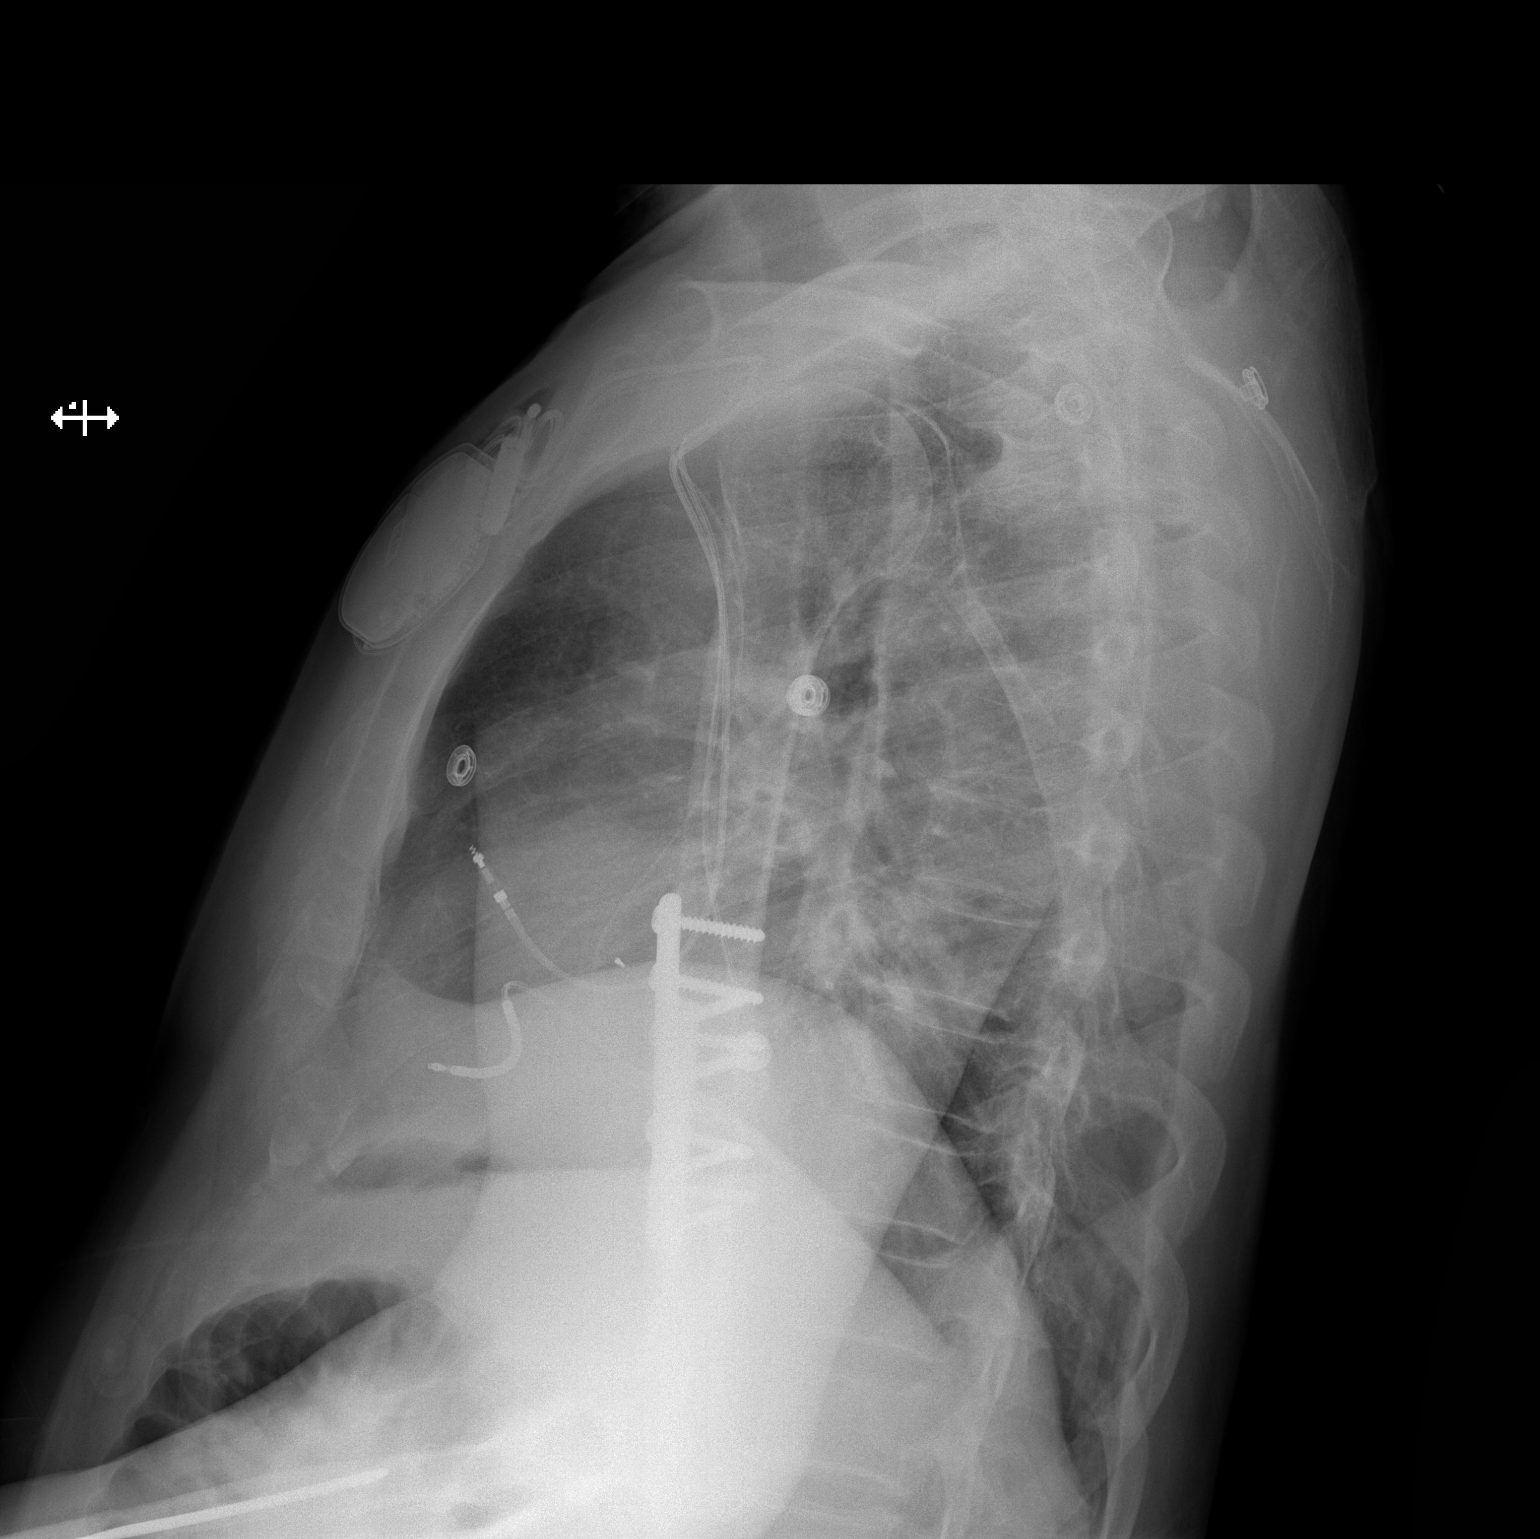

[2 of 2 positions shown; findings below may reference images not displayed]

FINDINGS: Cardiac shadow is stable. Defibrillator is again identified with
revision of the coronary sinus lead. No pneumothorax is noted. No
focal infiltrate or effusion is seen. Postsurgical changes are noted
in the mid left humerus. No other bony abnormality is seen.
IMPRESSION: No pneumothorax following lead revision.

## 2021-10-19 ENCOUNTER — Other Ambulatory Visit: Payer: Self-pay | Admitting: Cardiology

## 2021-11-04 ENCOUNTER — Ambulatory Visit (INDEPENDENT_AMBULATORY_CARE_PROVIDER_SITE_OTHER): Payer: PPO

## 2021-11-04 DIAGNOSIS — I110 Hypertensive heart disease with heart failure: Secondary | ICD-10-CM

## 2021-11-09 LAB — CUP PACEART REMOTE DEVICE CHECK
Battery Remaining Longevity: 82 mo
Battery Remaining Percentage: 81 %
Battery Voltage: 2.98 V
Brady Statistic AP VP Percent: 8 %
Brady Statistic AP VS Percent: 1 %
Brady Statistic AS VP Percent: 91 %
Brady Statistic AS VS Percent: 1 %
Brady Statistic RA Percent Paced: 8.8 %
Date Time Interrogation Session: 20231003130753
HighPow Impedance: 71 Ohm
Implantable Lead Implant Date: 20220526
Implantable Lead Implant Date: 20220526
Implantable Lead Implant Date: 20220930
Implantable Lead Location: 753858
Implantable Lead Location: 753859
Implantable Lead Location: 753860
Implantable Pulse Generator Implant Date: 20220526
Lead Channel Impedance Value: 390 Ohm
Lead Channel Impedance Value: 410 Ohm
Lead Channel Impedance Value: 890 Ohm
Lead Channel Pacing Threshold Amplitude: 0.5 V
Lead Channel Pacing Threshold Amplitude: 0.75 V
Lead Channel Pacing Threshold Amplitude: 1 V
Lead Channel Pacing Threshold Pulse Width: 0.5 ms
Lead Channel Pacing Threshold Pulse Width: 0.5 ms
Lead Channel Pacing Threshold Pulse Width: 0.5 ms
Lead Channel Sensing Intrinsic Amplitude: 3.9 mV
Lead Channel Sensing Intrinsic Amplitude: 9.7 mV
Lead Channel Setting Pacing Amplitude: 1.5 V
Lead Channel Setting Pacing Amplitude: 1.75 V
Lead Channel Setting Pacing Amplitude: 2.5 V
Lead Channel Setting Pacing Pulse Width: 0.5 ms
Lead Channel Setting Pacing Pulse Width: 0.5 ms
Lead Channel Setting Sensing Sensitivity: 0.5 mV
Pulse Gen Serial Number: 111044700

## 2021-11-10 NOTE — Progress Notes (Signed)
Remote ICD transmission.   

## 2022-02-03 ENCOUNTER — Ambulatory Visit (INDEPENDENT_AMBULATORY_CARE_PROVIDER_SITE_OTHER): Payer: PPO

## 2022-02-03 DIAGNOSIS — I447 Left bundle-branch block, unspecified: Secondary | ICD-10-CM | POA: Diagnosis not present

## 2022-02-07 ENCOUNTER — Telehealth: Payer: Self-pay

## 2022-02-07 LAB — CUP PACEART REMOTE DEVICE CHECK
Battery Remaining Longevity: 78 mo
Battery Remaining Percentage: 78 %
Battery Voltage: 2.98 V
Brady Statistic AP VP Percent: 8.2 %
Brady Statistic AP VS Percent: 1 %
Brady Statistic AS VP Percent: 91 %
Brady Statistic AS VS Percent: 1 %
Brady Statistic RA Percent Paced: 8.9 %
Date Time Interrogation Session: 20231230083547
HighPow Impedance: 70 Ohm
Implantable Lead Connection Status: 753985
Implantable Lead Connection Status: 753985
Implantable Lead Connection Status: 753985
Implantable Lead Implant Date: 20220526
Implantable Lead Implant Date: 20220526
Implantable Lead Implant Date: 20220930
Implantable Lead Location: 753858
Implantable Lead Location: 753859
Implantable Lead Location: 753860
Implantable Pulse Generator Implant Date: 20220526
Lead Channel Impedance Value: 390 Ohm
Lead Channel Impedance Value: 430 Ohm
Lead Channel Impedance Value: 860 Ohm
Lead Channel Pacing Threshold Amplitude: 0.5 V
Lead Channel Pacing Threshold Amplitude: 0.625 V
Lead Channel Pacing Threshold Amplitude: 1 V
Lead Channel Pacing Threshold Pulse Width: 0.5 ms
Lead Channel Pacing Threshold Pulse Width: 0.5 ms
Lead Channel Pacing Threshold Pulse Width: 0.5 ms
Lead Channel Sensing Intrinsic Amplitude: 3.6 mV
Lead Channel Sensing Intrinsic Amplitude: 9.5 mV
Lead Channel Setting Pacing Amplitude: 1.5 V
Lead Channel Setting Pacing Amplitude: 1.625
Lead Channel Setting Pacing Amplitude: 2.5 V
Lead Channel Setting Pacing Pulse Width: 0.5 ms
Lead Channel Setting Pacing Pulse Width: 0.5 ms
Lead Channel Setting Sensing Sensitivity: 0.5 mV
Pulse Gen Serial Number: 111044700
Zone Setting Status: 755011

## 2022-02-07 NOTE — Telephone Encounter (Signed)
Alert received from [employer]:  1 noise reversion atrial and ventricular channel 11/28, no procedures noted in EPIC, route to triage  Pt overdue for follow up.  Will schedule ASAP with WC for evaluation.

## 2022-02-08 NOTE — Telephone Encounter (Signed)
Outreach made to Pt.  Pt scheduled for 02/27/22 for 1 yr follow up and to evaluate noise on leads.

## 2022-02-20 NOTE — Progress Notes (Signed)
Remote ICD transmission.   

## 2022-02-23 ENCOUNTER — Other Ambulatory Visit: Payer: Self-pay | Admitting: Cardiology

## 2022-02-27 ENCOUNTER — Ambulatory Visit: Payer: PPO | Attending: Cardiology | Admitting: Cardiology

## 2022-02-27 ENCOUNTER — Encounter: Payer: Self-pay | Admitting: Cardiology

## 2022-02-27 VITALS — BP 148/86 | HR 64 | Ht 70.0 in | Wt 183.0 lb

## 2022-02-27 DIAGNOSIS — D6869 Other thrombophilia: Secondary | ICD-10-CM | POA: Diagnosis not present

## 2022-02-27 DIAGNOSIS — I2510 Atherosclerotic heart disease of native coronary artery without angina pectoris: Secondary | ICD-10-CM | POA: Diagnosis not present

## 2022-02-27 DIAGNOSIS — I5022 Chronic systolic (congestive) heart failure: Secondary | ICD-10-CM

## 2022-02-27 LAB — CUP PACEART INCLINIC DEVICE CHECK
Battery Remaining Longevity: 75 mo
Brady Statistic RA Percent Paced: 8.6 %
Brady Statistic RV Percent Paced: 99 %
Date Time Interrogation Session: 20240122093604
HighPow Impedance: 73.125
Implantable Lead Connection Status: 753985
Implantable Lead Connection Status: 753985
Implantable Lead Connection Status: 753985
Implantable Lead Implant Date: 20220526
Implantable Lead Implant Date: 20220526
Implantable Lead Implant Date: 20220930
Implantable Lead Location: 753858
Implantable Lead Location: 753859
Implantable Lead Location: 753860
Implantable Pulse Generator Implant Date: 20220526
Lead Channel Impedance Value: 375 Ohm
Lead Channel Impedance Value: 437.5 Ohm
Lead Channel Impedance Value: 875 Ohm
Lead Channel Pacing Threshold Amplitude: 0.5 V
Lead Channel Pacing Threshold Amplitude: 0.5 V
Lead Channel Pacing Threshold Amplitude: 0.75 V
Lead Channel Pacing Threshold Amplitude: 0.75 V
Lead Channel Pacing Threshold Amplitude: 1.25 V
Lead Channel Pacing Threshold Amplitude: 1.25 V
Lead Channel Pacing Threshold Pulse Width: 0.5 ms
Lead Channel Pacing Threshold Pulse Width: 0.5 ms
Lead Channel Pacing Threshold Pulse Width: 0.5 ms
Lead Channel Pacing Threshold Pulse Width: 0.5 ms
Lead Channel Pacing Threshold Pulse Width: 0.5 ms
Lead Channel Pacing Threshold Pulse Width: 0.5 ms
Lead Channel Sensing Intrinsic Amplitude: 11.4 mV
Lead Channel Sensing Intrinsic Amplitude: 3.6 mV
Lead Channel Setting Pacing Amplitude: 1.5 V
Lead Channel Setting Pacing Amplitude: 1.75 V
Lead Channel Setting Pacing Amplitude: 2.5 V
Lead Channel Setting Pacing Pulse Width: 0.5 ms
Lead Channel Setting Pacing Pulse Width: 0.5 ms
Lead Channel Setting Sensing Sensitivity: 0.5 mV
Pulse Gen Serial Number: 111044700
Zone Setting Status: 755011

## 2022-02-27 NOTE — Progress Notes (Signed)
Electrophysiology Office Note   Date:  02/27/2022   ID:  Victor, Hood 01/24/1956, MRN [id number]  PCP:  Serita Grammes, MD  Cardiologist:  Bettina Gavia Primary Electrophysiologist:  Elivia Robotham Meredith Leeds, MD    Chief Complaint: CHF   History of Present Illness: Victor Hood is a 67 y.o. male who is being seen today for the evaluation of CHF at the request of Serita Grammes, MD. Presenting today for electrophysiology evaluation.  He has a history significant for chronic systolic heart failure, hypertension, hyperlipidemia.  Echo 03/04/2019 showed an ejection fraction of 30 to 35%.  1 year later, he had a persistently low ejection fraction.  He is status post Abbott CRT-D completed 07/01/2020.  He has a history significant for chronic systolic heart failure, hypertension, hyperlipidemia.  Echo 03/04/2019 showed an ejection fraction of 30 to 35%.  Echo 1 year later showed a persistently low ejection fraction.  He is status post Abbott CRT-D implanted 07/01/2020.  Unfortunately, his LV lead dislodged and he is now status post lead revision 11/05/2020.  Today, denies symptoms of palpitations, chest pain, shortness of breath, orthopnea, PND, lower extremity edema, claudication, dizziness, presyncope, syncope, bleeding, or neurologic sequela. The patient is tolerating medications without difficulties.  Since being seen he has done well.  He has had no issues since his lead revision.  He is feeling much improved and has more energy and less shortness of breath.   Past Medical History:  Diagnosis Date   Chronic left systolic heart failure ([place]) 08/02/2017   Hyperlipidemia 08/02/2017   Hypertensive heart disease with heart failure ([place]) 08/02/2017   Kidney stone 0/16/0109   Metabolic syndrome 04/29/5571   Statin intolerance 07/04/2016   Past Surgical History:  Procedure Laterality Date   BACK SURGERY  2007   decrompressed herniated disc   BIV ICD INSERTION CRT-D N/A 07/01/2020    Procedure: BIV ICD INSERTION CRT-D;  Surgeon: Constance Haw, MD;  Location: [facility];  Service: Cardiovascular;  Laterality: N/A;   FOREARM FRACTURE SURGERY Left 1976   HUMERUS FRACTURE SURGERY Left 1976   LEAD REVISION/REPAIR N/A 11/05/2020   Procedure: LEAD REVISION/REPAIR;  Surgeon: Constance Haw, MD;  Location: [facility];  Service: Cardiovascular;  Laterality: N/A;     Current Outpatient Medications  Medication Sig Dispense Refill   aspirin EC 81 MG tablet Take 81 mg by mouth daily.     carvedilol (COREG) 25 MG tablet Take 1 tablet (25 mg total) by mouth 2 (two) times daily with a meal. 180 tablet 3   ENTRESTO 97-103 MG Take 1 tablet by mouth twice daily 180 tablet 2   eplerenone (INSPRA) 25 MG tablet Take 1 tablet by mouth once daily 90 tablet 1   ezetimibe (ZETIA) 10 MG tablet Take 1 tablet by mouth once daily 90 tablet 0   Multiple Vitamins-Minerals (MULTIVITAMIN WITH MINERALS) tablet Take 1 tablet by mouth 3 (three) times a week.     pravastatin (PRAVACHOL) 80 MG tablet Take 80 mg by mouth 2 (two) times a week.     No current facility-administered medications for this visit.    Allergies:   Tape   Social History:  The patient  reports that he has never smoked. He has never been exposed to tobacco smoke. He has never used smokeless tobacco. He reports current alcohol use of about 7.0 - 14.0 standard drinks of alcohol per week. He reports that he does not use drugs.   Family  History:  The patient's family history includes CVA in his mother; Congestive Heart Failure in his father and mother; Diabetes in his mother; Hypercholesterolemia in his maternal grandfather, maternal grandmother, and mother; Pancreatic cancer in his paternal uncle; Prostate cancer in his paternal uncle.   ROS:  Please see the history of present illness.   Otherwise, review of systems is positive for none.   All other systems are reviewed and negative.   PHYSICAL EXAM: VS:  BP  (!) 148/86   Pulse 64   Ht 5\' 10"  (1.778 m)   Wt 183 lb (83 kg)   SpO2 97%   BMI 26.26 kg/m  , BMI Body mass index is 26.26 kg/m. GEN: Well nourished, well developed, in no acute distress  HEENT: normal  Neck: no JVD, carotid bruits, or masses Cardiac: RRR; no murmurs, rubs, or gallops,no edema  Respiratory:  clear to auscultation bilaterally, normal work of breathing GI: soft, nontender, nondistended, + BS MS: no deformity or atrophy  Skin: warm and dry, device site well healed Neuro:  Strength and sensation are intact Psych: euthymic mood, full affect  EKG:  EKG is ordered today. Personal review of the ekg ordered shows AV paced  Personal review of the device interrogation today. Results in [facility]: 08/23/2021: ALT 8; BUN 8; Creatinine, Ser 1.31; NT-Pro BNP 184; Potassium 4.2; Sodium 140    Lipid Panel     Component Value Date/Time   CHOL 170 08/23/2021 1439   TRIG 250 (H) 08/23/2021 1439   HDL 33 (L) 08/23/2021 1439   CHOLHDL 5.2 (H) 08/23/2021 1439   LDLCALC 95 08/23/2021 1439     Wt Readings from Last 3 Encounters:  02/27/22 183 lb (83 kg)  08/23/21 193 lb (87.5 kg)  02/23/21 199 lb 12.8 oz (90.6 kg)      Other studies Reviewed: Additional studies/ records that were reviewed today include: TTE 02/02/21 Review of the above records today demonstrates:   1. Left ventricular ejection fraction, by estimation, is 60 to 65%. The  left ventricle has normal function. The left ventricle has no regional  wall motion abnormalities. There is moderate left ventricular hypertrophy.  Left ventricular diastolic  parameters are consistent with Grade I diastolic dysfunction (impaired  relaxation).   2. Right ventricular systolic function is normal. The right ventricular  size is normal. There is normal pulmonary artery systolic pressure.   3. The mitral valve is normal in structure. No evidence of mitral valve  regurgitation. No evidence of mitral stenosis.    4. The aortic valve is normal in structure. Aortic valve regurgitation is  not visualized. No aortic stenosis is present.   5. The inferior vena cava is normal in size with greater than 50%  respiratory variability, suggesting right atrial pressure of 3 mmHg.   Coronary CTA 01/16/2020 1. Moderate Coronary artery disease. CADRADS 3. This study Anam Bobby be send for FFRct.   2. Coronary calcium score of 449. This was 58 percentile for age and sex matched control.   3. Normal coronary origin with right dominance.  1. Left Main: 0.97   2. LAD: Proximal 0.90  Mid 0.80 distal 0.79   3. LCX: Proximal 0.70  Mid 0.60 distal not analyzed   4. RCA: Proximal 0.86 Mid 0.79  Distal 0.76   ASSESSMENT AND PLAN:  1.  Chronic systolic heart failure: Currently on optimal medical therapy with Entresto and carvedilol.  Ejection fraction of 30 to 35%.  Status post Abbott CRT-D  implanted 07/01/2020.  LV threshold was significantly elevated and he is status post lead revision 11/05/2020.  Device functioning appropriately.  No changes at this time.  2.  Hypertension: Initially elevated.  Improving on recheck. Continue plan per primary cardiology and primary physician.  3.  Coronary artery disease: No flow-limiting stenosis on coronary CT coronary clean her primary cardiologist.   Current medicines are reviewed at length with the patient today.   The patient does not have concerns regarding his medicines.  The following changes were made today: none  Labs/ tests ordered today include:  Orders Placed This Encounter  Procedures   CUP PACEART INCLINIC DEVICE CHECK   EKG 12-Lead      Disposition:   FU12 months  Signed, Oda Placke [name], MD  02/27/2022 9:52 AM     [facility] [address] [place] [address] [phone] (office) [phone] (fax)

## 2022-02-27 NOTE — Patient Instructions (Signed)
Medication Instructions:  Your physician recommends that you continue on your current medications as directed. Please refer to the Current Medication list given to you today.  *If you need a refill on your cardiac medications before your next appointment, please call your pharmacy*   Lab Work: None ordered   Testing/Procedures: None ordered   Follow-Up: At [facility], you and your health needs are our priority.  As part of our continuing mission to provide you with exceptional heart care, we have created designated Provider Care Teams.  These Care Teams include your primary Cardiologist (physician) and Advanced Practice Providers (APPs -  Physician Assistants and Nurse Practitioners) who all work together to provide you with the care you need, when you need it.  Remote monitoring is used to monitor your Pacemaker or ICD from home. This monitoring reduces the number of office visits required to check your device to one time per year. It allows [place] to keep an eye on the functioning of your device to ensure it is working properly. You are scheduled for a device check from home on 05/05/2022. You may send your transmission at any time that day. If you have a wireless device, the transmission will be sent automatically. After your physician reviews your transmission, you will receive a postcard with your next transmission date.  Your next appointment:   1 year(s)  The format for your next appointment:   In Person  Provider:   Allegra Lai, MD    Thank you for choosing [facility]!!   Trinidad Curet, RN [phone]

## 2022-03-14 NOTE — Progress Notes (Unsigned)
Cardiology Office Note:    Date:  03/15/2022   ID:  Victor Hood, DOB 1955/03/14, MRN [id number]  PCP:  Serita Grammes, MD  Cardiologist:  Shirlee More, MD    Referring MD: Serita Grammes, MD    ASSESSMENT:    1. Chronic left systolic heart failure ([place])   2. Left bundle branch block   3. Biventricular cardiac pacemaker in situ   4. Hypertensive heart disease with heart failure ([place])   5. Secondary hypercoagulable state ([place])   6. Coronary artery disease involving native coronary artery of native heart without angina pectoris   7. Mixed hyperlipidemia    PLAN:    In order of problems listed above:  Jovante has done well New York Heart Association class I on guideline directed therapy and normalization of ejection fraction after CRT therapy with left bundle branch block and cardiomyopathy and will be 2 years we will recheck an echocardiogram in December.  He will continue to be seen every 6 months in the office and I advised him to continue his guideline directed therapy including carvedilol MRA and Entresto. Continue to follow in device clinic he has had a complete full response to CRT with normalization of ejection fraction No evidence of volume overload not on a loop diuretic and continue his guideline directed treatment I asked him to start checking his home blood pressure good technique validated device several times a week and record and bring to follow-up visits Stable CAD having no anginal discomfort continue his treatment and aspirin as well as lipid-lowering therapy today recheck liver function lipids   Next appointment: 6 months with our nurse practitioner and I will see him back in the office 1 year   Medication Adjustments/Labs and Tests Ordered: Current medicines are reviewed at length with the patient today.  Concerns regarding medicines are outlined above.  No orders of the defined types were placed in this encounter.  No orders of the defined types were  placed in this encounter.   Chief Complaint  Patient presents with   Follow-up   Congestive Heart Failure   Cardiomyopathy    With LBBB and CRT  For his cardiomyopathy with left bundle branch block  History of Present Illness:    Victor Hood is a 67 y.o. male with a hx of chronic systolic heart failure EF 30 to 35% with left bundle branch block and prolonged QRS duration and subsequent cardiac resynchronization therapy with normalization of his ejection fraction hypertensive heart disease hyperlipidemia and CAD with 25% right coronary artery and left main less than 25% LAD 25% and left circumflex marginal 50 to 69%  last seen 02/23/2021.  His echocardiogram 02/02/2021 showed a profound improvement in EF 60 to 65% moderate LVH normal right ventricular size function and pulmonary artery pressure last seen 08/23/2021.  Compliance with diet, lifestyle and medications: Yes  He continues to do well having no cardiovascular symptoms he is involved in farming poultry and has no exercise intolerance edema shortness of breath chest pain palpitation or syncope He tolerates his statin without muscle pain or weakness Past Medical History:  Diagnosis Date   Chronic left systolic heart failure ([place]) 08/02/2017   Hyperlipidemia 08/02/2017   Hypertensive heart disease with heart failure ([place]) 08/02/2017   Kidney stone 07/03/7822   Metabolic syndrome 2/35/3614   Statin intolerance 07/04/2016    Past Surgical History:  Procedure Laterality Date   BACK SURGERY  2007   decrompressed herniated disc   BIV ICD INSERTION CRT-D  N/A 07/01/2020   Procedure: BIV ICD INSERTION CRT-D;  Surgeon: Constance Haw, MD;  Location: [facility];  Service: Cardiovascular;  Laterality: N/A;   FOREARM FRACTURE SURGERY Left 1976   HUMERUS FRACTURE SURGERY Left 1976   LEAD REVISION/REPAIR N/A 11/05/2020   Procedure: LEAD REVISION/REPAIR;  Surgeon: Constance Haw, MD;  Location: [facility];   Service: Cardiovascular;  Laterality: N/A;    Current Medications: Current Meds  Medication Sig   aspirin EC 81 MG tablet Take 81 mg by mouth daily.   carvedilol (COREG) 25 MG tablet Take 1 tablet (25 mg total) by mouth 2 (two) times daily with a meal.   ENTRESTO 97-103 MG Take 1 tablet by mouth twice daily   eplerenone (INSPRA) 25 MG tablet Take 1 tablet by mouth once daily   ezetimibe (ZETIA) 10 MG tablet Take 1 tablet by mouth once daily   Multiple Vitamins-Minerals (MULTIVITAMIN WITH MINERALS) tablet Take 1 tablet by mouth 3 (three) times a week.   pravastatin (PRAVACHOL) 80 MG tablet Take 80 mg by mouth 2 (two) times a week.     Allergies:   Tape   Social History   Socioeconomic History   Marital status: Married    Spouse name: Not on file   Number of children: Not on file   Years of education: Not on file   Highest education level: Not on file  Occupational History   Not on file  Tobacco Use   Smoking status: Never    Passive exposure: Never   Smokeless tobacco: Never  Vaping Use   Vaping Use: Never used  Substance and Sexual Activity   Alcohol use: Yes    Alcohol/week: 7.0 - 14.0 standard drinks of alcohol    Types: 7 - 14 Cans of beer per week   Drug use: Never   Sexual activity: Not on file  Other Topics Concern   Not on file  Social History Narrative   Not on file   Social Determinants of Health   Financial Resource Strain: Not on file  Food Insecurity: Not on file  Transportation Needs: Not on file  Physical Activity: Not on file  Stress: Not on file  Social Connections: Not on file     Family History: The patient's family history includes CVA in his mother; Congestive Heart Failure in his father and mother; Diabetes in his mother; Hypercholesterolemia in his maternal grandfather, maternal grandmother, and mother; Pancreatic cancer in his paternal uncle; Prostate cancer in his paternal uncle. ROS:   Please see the history of present illness.    All  other systems reviewed and are negative.  EKGs/Labs/Other Studies Reviewed:    The following studies were reviewed today: Recent Labs: 08/23/2021: ALT 8; BUN 8; Creatinine, Ser 1.31; NT-Pro BNP 184; Potassium 4.2; Sodium 140  Recent Lipid Panel    Component Value Date/Time   CHOL 170 08/23/2021 1439   TRIG 250 (H) 08/23/2021 1439   HDL 33 (L) 08/23/2021 1439   CHOLHDL 5.2 (H) 08/23/2021 1439   LDLCALC 95 08/23/2021 1439    Physical Exam:    VS:  BP 138/70 (BP Location: Left Arm, Patient Position: Sitting, Cuff Size: Normal)   Pulse (!) 52   Ht 5\' 10"  (1.778 m)   Wt 182 lb (82.6 kg)   SpO2 96%   BMI 26.11 kg/m     Wt Readings from Last 3 Encounters:  03/15/22 182 lb (82.6 kg)  02/27/22 183 lb (83 kg)  08/23/21 193 lb (87.5 kg)     GEN:  Well nourished, well developed in no acute distress HEENT: Normal NECK: No JVD; No carotid bruits LYMPHATICS: No lymphadenopathy CARDIAC: RRR, no murmurs, rubs, gallops RESPIRATORY:  Clear to auscultation without rales, wheezing or rhonchi  ABDOMEN: Soft, non-tender, non-distended MUSCULOSKELETAL:  No edema; No deformity  SKIN: Warm and dry NEUROLOGIC:  Alert and oriented x 3 PSYCHIATRIC:  Normal affect   Seen with Truddie Hidden, RN chaperone   Signed, Shirlee More, MD  03/15/2022 10:27 AM    [place]

## 2022-03-15 ENCOUNTER — Ambulatory Visit: Payer: PPO | Attending: Cardiology | Admitting: Cardiology

## 2022-03-15 ENCOUNTER — Encounter: Payer: Self-pay | Admitting: Cardiology

## 2022-03-15 VITALS — BP 138/70 | HR 52 | Ht 70.0 in | Wt 182.0 lb

## 2022-03-15 DIAGNOSIS — I110 Hypertensive heart disease with heart failure: Secondary | ICD-10-CM

## 2022-03-15 DIAGNOSIS — I447 Left bundle-branch block, unspecified: Secondary | ICD-10-CM

## 2022-03-15 DIAGNOSIS — I2510 Atherosclerotic heart disease of native coronary artery without angina pectoris: Secondary | ICD-10-CM

## 2022-03-15 DIAGNOSIS — E782 Mixed hyperlipidemia: Secondary | ICD-10-CM | POA: Diagnosis not present

## 2022-03-15 DIAGNOSIS — Z950 Presence of cardiac pacemaker: Secondary | ICD-10-CM

## 2022-03-15 DIAGNOSIS — I5022 Chronic systolic (congestive) heart failure: Secondary | ICD-10-CM | POA: Diagnosis not present

## 2022-03-15 DIAGNOSIS — D6869 Other thrombophilia: Secondary | ICD-10-CM | POA: Diagnosis not present

## 2022-03-15 NOTE — Patient Instructions (Signed)
Medication Instructions:  Your physician recommends that you continue on your current medications as directed. Please refer to the Current Medication list given to you today.  *If you need a refill on your cardiac medications before your next appointment, please call your pharmacy*   Lab Work: Your physician recommends that you return for lab work in:   Labs today: CMP, Lipids, Pro BNP  If you have labs (blood work) drawn today and your tests are completely normal, you will receive your results only by: MyChart Message (if you have MyChart) OR A paper copy in the mail If you have any lab test that is abnormal or we need to change your treatment, we will call you to review the results.   Testing/Procedures: Your physician has requested that you have an echocardiogram. Echocardiography is a painless test that uses sound waves to create images of your heart. It provides your doctor with information about the size and shape of your heart and how well your heart's chambers and valves are working. This procedure takes approximately one hour. There are no restrictions for this procedure. Please do NOT wear cologne, perfume, aftershave, or lotions (deodorant is allowed). Please arrive 15 minutes prior to your appointment time.    Follow-Up: At [facility], you and your health needs are our priority.  As part of our continuing mission to provide you with exceptional heart care, we have created designated Provider Care Teams.  These Care Teams include your primary Cardiologist (physician) and Advanced Practice Providers (APPs -  Physician Assistants and Nurse Practitioners) who all work together to provide you with the care you need, when you need it.  We recommend signing up for the patient portal called "MyChart".  Sign up information is provided on this After Visit Summary.  MyChart is used to connect with patients for Virtual Visits (Telemedicine).  Patients are able to view lab/test  results, encounter notes, upcoming appointments, etc.  Non-urgent messages can be sent to your provider as well.   To learn more about what you can do with MyChart, go to [email or web address].    Your next appointment:   1 year   Provider:   Shirlee More, MD  Other Instructions Check and record home blood pressures  2 - 3 times per week.  This visit was accompanied by Truddie Hidden.

## 2022-03-18 LAB — PRO B NATRIURETIC PEPTIDE: NT-Pro BNP: 121 pg/mL (ref 0–376)

## 2022-03-18 LAB — COMPREHENSIVE METABOLIC PANEL
ALT: 10 IU/L (ref 0–44)
AST: 19 IU/L (ref 0–40)
Albumin/Globulin Ratio: 1.8 (ref 1.2–2.2)
Albumin: 4.2 g/dL (ref 3.9–4.9)
Alkaline Phosphatase: 84 IU/L (ref 44–121)
BUN/Creatinine Ratio: 16 (ref 10–24)
BUN: 16 mg/dL (ref 8–27)
Bilirubin Total: 0.5 mg/dL (ref 0.0–1.2)
CO2: 22 mmol/L (ref 20–29)
Calcium: 9.4 mg/dL (ref 8.6–10.2)
Chloride: 104 mmol/L (ref 96–106)
Creatinine, Ser: 0.99 mg/dL (ref 0.76–1.27)
Globulin, Total: 2.4 g/dL (ref 1.5–4.5)
Glucose: 91 mg/dL (ref 70–99)
Potassium: 4.9 mmol/L (ref 3.5–5.2)
Sodium: 139 mmol/L (ref 134–144)
Total Protein: 6.6 g/dL (ref 6.0–8.5)
eGFR: 83 mL/min/{1.73_m2} (ref >59)

## 2022-03-18 LAB — LIPID PANEL
Chol/HDL Ratio: 3.7 ratio (ref 0.0–5.0)
Cholesterol, Total: 152 mg/dL (ref 100–199)
HDL: 41 mg/dL (ref >39)
LDL Chol Calc (NIH): 91 mg/dL (ref 0–99)
Triglycerides: 112 mg/dL (ref 0–149)
VLDL Cholesterol Cal: 20 mg/dL (ref 5–40)

## 2022-03-27 ENCOUNTER — Other Ambulatory Visit: Payer: Self-pay | Admitting: Cardiology

## 2022-04-25 ENCOUNTER — Other Ambulatory Visit: Payer: Self-pay | Admitting: Cardiology

## 2022-04-28 ENCOUNTER — Other Ambulatory Visit: Payer: Self-pay

## 2022-04-28 NOTE — Telephone Encounter (Signed)
Refill to pharmacy 

## 2022-05-01 ENCOUNTER — Other Ambulatory Visit: Payer: Self-pay

## 2022-05-04 ENCOUNTER — Telehealth: Payer: Self-pay | Admitting: Cardiology

## 2022-05-04 MED ORDER — ENTRESTO 97-103 MG PO TABS: 1.0000 | ORAL_TABLET | Freq: Two times a day (BID) | ORAL | 3 refills | Status: AC

## 2022-05-04 NOTE — Telephone Encounter (Signed)
*  STAT* If patient is at the pharmacy, call can be transferred to refill team.   1. Which medications need to be refilled? (please list name of each medication and dose if known)   ENTRESTO 97-103 MG    2. Which pharmacy/location (including street and city if local pharmacy) is medication to be sent to?   [facility] [place], [place]    3. Do they need a 30 day or 90 day supply? [facility]

## 2022-05-05 ENCOUNTER — Ambulatory Visit (INDEPENDENT_AMBULATORY_CARE_PROVIDER_SITE_OTHER): Payer: PPO

## 2022-05-05 DIAGNOSIS — I447 Left bundle-branch block, unspecified: Secondary | ICD-10-CM

## 2022-05-05 LAB — CUP PACEART REMOTE DEVICE CHECK
Battery Remaining Longevity: 76 mo
Battery Remaining Percentage: 75 %
Battery Voltage: 2.96 V
Brady Statistic AP VP Percent: 7.7 %
Brady Statistic AP VS Percent: 1 %
Brady Statistic AS VP Percent: 91 %
Brady Statistic AS VS Percent: 1 %
Brady Statistic RA Percent Paced: 8.4 %
Date Time Interrogation Session: 20240329030740
HighPow Impedance: 71 Ohm
Implantable Lead Connection Status: 753985
Implantable Lead Connection Status: 753985
Implantable Lead Connection Status: 753985
Implantable Lead Implant Date: 20220526
Implantable Lead Implant Date: 20220526
Implantable Lead Implant Date: 20220930
Implantable Lead Location: 753858
Implantable Lead Location: 753859
Implantable Lead Location: 753860
Implantable Pulse Generator Implant Date: 20220526
Lead Channel Impedance Value: 380 Ohm
Lead Channel Impedance Value: 410 Ohm
Lead Channel Impedance Value: 940 Ohm
Lead Channel Pacing Threshold Amplitude: 0.625 V
Lead Channel Pacing Threshold Amplitude: 0.625 V
Lead Channel Pacing Threshold Amplitude: 1.25 V
Lead Channel Pacing Threshold Pulse Width: 0.5 ms
Lead Channel Pacing Threshold Pulse Width: 0.5 ms
Lead Channel Pacing Threshold Pulse Width: 0.5 ms
Lead Channel Sensing Intrinsic Amplitude: 11.7 mV
Lead Channel Sensing Intrinsic Amplitude: 4.5 mV
Lead Channel Setting Pacing Amplitude: 1.625
Lead Channel Setting Pacing Amplitude: 1.625
Lead Channel Setting Pacing Amplitude: 2.5 V
Lead Channel Setting Pacing Pulse Width: 0.5 ms
Lead Channel Setting Pacing Pulse Width: 0.5 ms
Lead Channel Setting Sensing Sensitivity: 0.5 mV
Pulse Gen Serial Number: 111044700
Zone Setting Status: 755011

## 2022-06-06 NOTE — Progress Notes (Signed)
Remote ICD transmission.   

## 2022-08-04 ENCOUNTER — Ambulatory Visit (INDEPENDENT_AMBULATORY_CARE_PROVIDER_SITE_OTHER): Payer: PPO

## 2022-08-04 DIAGNOSIS — I447 Left bundle-branch block, unspecified: Secondary | ICD-10-CM

## 2022-08-04 LAB — CUP PACEART REMOTE DEVICE CHECK
Battery Remaining Longevity: 73 mo
Battery Remaining Percentage: 73 %
Battery Voltage: 2.96 V
Brady Statistic AP VP Percent: 7.5 %
Brady Statistic AP VS Percent: 1 %
Brady Statistic AS VP Percent: 91 %
Brady Statistic AS VS Percent: 1 %
Brady Statistic RA Percent Paced: 8.2 %
Date Time Interrogation Session: 20240628021552
HighPow Impedance: 72 Ohm
Implantable Lead Connection Status: 753985
Implantable Lead Connection Status: 753985
Implantable Lead Connection Status: 753985
Implantable Lead Implant Date: 20220526
Implantable Lead Implant Date: 20220526
Implantable Lead Implant Date: 20220930
Implantable Lead Location: 753858
Implantable Lead Location: 753859
Implantable Lead Location: 753860
Implantable Pulse Generator Implant Date: 20220526
Lead Channel Impedance Value: 380 Ohm
Lead Channel Impedance Value: 400 Ohm
Lead Channel Impedance Value: 880 Ohm
Lead Channel Pacing Threshold Amplitude: 0.5 V
Lead Channel Pacing Threshold Amplitude: 0.75 V
Lead Channel Pacing Threshold Amplitude: 1.25 V
Lead Channel Pacing Threshold Pulse Width: 0.5 ms
Lead Channel Pacing Threshold Pulse Width: 0.5 ms
Lead Channel Pacing Threshold Pulse Width: 0.5 ms
Lead Channel Sensing Intrinsic Amplitude: 11.7 mV
Lead Channel Sensing Intrinsic Amplitude: 4.3 mV
Lead Channel Setting Pacing Amplitude: 1.5 V
Lead Channel Setting Pacing Amplitude: 1.75 V
Lead Channel Setting Pacing Amplitude: 2.5 V
Lead Channel Setting Pacing Pulse Width: 0.5 ms
Lead Channel Setting Pacing Pulse Width: 0.5 ms
Lead Channel Setting Sensing Sensitivity: 0.5 mV
Pulse Gen Serial Number: 111044700
Zone Setting Status: 755011

## 2022-08-17 NOTE — Progress Notes (Signed)
Remote ICD transmission.   

## 2022-08-22 ENCOUNTER — Other Ambulatory Visit: Payer: Self-pay | Admitting: Cardiology

## 2022-09-09 ENCOUNTER — Other Ambulatory Visit: Payer: Self-pay | Admitting: Cardiology

## 2022-11-03 ENCOUNTER — Ambulatory Visit (INDEPENDENT_AMBULATORY_CARE_PROVIDER_SITE_OTHER): Payer: PPO

## 2022-11-03 DIAGNOSIS — I447 Left bundle-branch block, unspecified: Secondary | ICD-10-CM | POA: Diagnosis not present

## 2022-11-03 LAB — CUP PACEART REMOTE DEVICE CHECK
Battery Remaining Longevity: 69 mo
Battery Remaining Percentage: 69 %
Battery Voltage: 2.96 V
Brady Statistic AP VP Percent: 7.8 %
Brady Statistic AP VS Percent: 1 %
Brady Statistic AS VP Percent: 91 %
Brady Statistic AS VS Percent: 1 %
Brady Statistic RA Percent Paced: 8.4 %
Date Time Interrogation Session: 20240927020643
HighPow Impedance: 65 Ohm
Implantable Lead Connection Status: 753985
Implantable Lead Connection Status: 753985
Implantable Lead Connection Status: 753985
Implantable Lead Implant Date: 20220526
Implantable Lead Implant Date: 20220526
Implantable Lead Implant Date: 20220930
Implantable Lead Location: 753858
Implantable Lead Location: 753859
Implantable Lead Location: 753860
Implantable Pulse Generator Implant Date: 20220526
Lead Channel Impedance Value: 350 Ohm
Lead Channel Impedance Value: 410 Ohm
Lead Channel Impedance Value: 840 Ohm
Lead Channel Pacing Threshold Amplitude: 0.5 V
Lead Channel Pacing Threshold Amplitude: 0.625 V
Lead Channel Pacing Threshold Amplitude: 1.25 V
Lead Channel Pacing Threshold Pulse Width: 0.5 ms
Lead Channel Pacing Threshold Pulse Width: 0.5 ms
Lead Channel Pacing Threshold Pulse Width: 0.5 ms
Lead Channel Sensing Intrinsic Amplitude: 3.5 mV
Lead Channel Sensing Intrinsic Amplitude: 8.7 mV
Lead Channel Setting Pacing Amplitude: 1.5 V
Lead Channel Setting Pacing Amplitude: 1.625
Lead Channel Setting Pacing Amplitude: 2.5 V
Lead Channel Setting Pacing Pulse Width: 0.5 ms
Lead Channel Setting Pacing Pulse Width: 0.5 ms
Lead Channel Setting Sensing Sensitivity: 0.5 mV
Pulse Gen Serial Number: 111044700
Zone Setting Status: 755011

## 2022-11-09 NOTE — Progress Notes (Signed)
Remote ICD transmission.   

## 2023-01-05 ENCOUNTER — Other Ambulatory Visit: Payer: Self-pay | Admitting: Cardiology

## 2023-01-15 ENCOUNTER — Other Ambulatory Visit: Payer: Self-pay | Admitting: Cardiology

## 2023-01-25 ENCOUNTER — Encounter: Payer: Self-pay | Admitting: Cardiology

## 2023-02-02 ENCOUNTER — Ambulatory Visit: Payer: PPO

## 2023-02-02 DIAGNOSIS — I447 Left bundle-branch block, unspecified: Secondary | ICD-10-CM

## 2023-02-03 LAB — CUP PACEART REMOTE DEVICE CHECK
Battery Remaining Longevity: 67 mo
Battery Remaining Percentage: 67 %
Battery Voltage: 2.96 V
Brady Statistic AP VP Percent: 7.7 %
Brady Statistic AP VS Percent: 1 %
Brady Statistic AS VP Percent: 91 %
Brady Statistic AS VS Percent: 1 %
Brady Statistic RA Percent Paced: 8.4 %
Date Time Interrogation Session: 20241227010457
HighPow Impedance: 74 Ohm
Implantable Lead Connection Status: 753985
Implantable Lead Connection Status: 753985
Implantable Lead Connection Status: 753985
Implantable Lead Implant Date: 20220526
Implantable Lead Implant Date: 20220526
Implantable Lead Implant Date: 20220930
Implantable Lead Location: 753858
Implantable Lead Location: 753859
Implantable Lead Location: 753860
Implantable Pulse Generator Implant Date: 20220526
Lead Channel Impedance Value: 360 Ohm
Lead Channel Impedance Value: 410 Ohm
Lead Channel Impedance Value: 830 Ohm
Lead Channel Pacing Threshold Amplitude: 0.5 V
Lead Channel Pacing Threshold Amplitude: 0.625 V
Lead Channel Pacing Threshold Amplitude: 1.25 V
Lead Channel Pacing Threshold Pulse Width: 0.5 ms
Lead Channel Pacing Threshold Pulse Width: 0.5 ms
Lead Channel Pacing Threshold Pulse Width: 0.5 ms
Lead Channel Sensing Intrinsic Amplitude: 4 mV
Lead Channel Sensing Intrinsic Amplitude: 9.3 mV
Lead Channel Setting Pacing Amplitude: 1.5 V
Lead Channel Setting Pacing Amplitude: 1.625
Lead Channel Setting Pacing Amplitude: 2.5 V
Lead Channel Setting Pacing Pulse Width: 0.5 ms
Lead Channel Setting Pacing Pulse Width: 0.5 ms
Lead Channel Setting Sensing Sensitivity: 0.5 mV
Pulse Gen Serial Number: 111044700
Zone Setting Status: 755011

## 2023-03-08 NOTE — Progress Notes (Signed)
Remote ICD transmission.

## 2023-03-08 NOTE — Addendum Note (Signed)
Addended by: Elease Etienne A on: 03/08/2023 02:52 PM   Modules accepted: Orders

## 2023-03-17 ENCOUNTER — Other Ambulatory Visit: Payer: Self-pay | Admitting: Cardiology

## 2023-03-19 NOTE — Telephone Encounter (Signed)
 Prescription sent to pharmacy.

## 2023-03-20 ENCOUNTER — Other Ambulatory Visit: Payer: Self-pay | Admitting: Cardiology

## 2023-04-04 ENCOUNTER — Other Ambulatory Visit: Payer: Self-pay | Admitting: Cardiology

## 2023-04-04 NOTE — Telephone Encounter (Signed)
 Rx refill sent to pharmacy.

## 2023-05-10 ENCOUNTER — Ambulatory Visit (INDEPENDENT_AMBULATORY_CARE_PROVIDER_SITE_OTHER)

## 2023-05-10 DIAGNOSIS — I447 Left bundle-branch block, unspecified: Secondary | ICD-10-CM

## 2023-05-11 LAB — CUP PACEART REMOTE DEVICE CHECK
Battery Remaining Longevity: 64 mo
Battery Remaining Percentage: 64 %
Battery Voltage: 2.96 V
Brady Statistic AP VP Percent: 7.3 %
Brady Statistic AP VS Percent: 1 %
Brady Statistic AS VP Percent: 91 %
Brady Statistic AS VS Percent: 1 %
Brady Statistic RA Percent Paced: 7.9 %
Date Time Interrogation Session: 20250403152203
HighPow Impedance: 73 Ohm
Implantable Lead Connection Status: 753985
Implantable Lead Connection Status: 753985
Implantable Lead Connection Status: 753985
Implantable Lead Implant Date: 20220526
Implantable Lead Implant Date: 20220526
Implantable Lead Implant Date: 20220930
Implantable Lead Location: 753858
Implantable Lead Location: 753859
Implantable Lead Location: 753860
Implantable Pulse Generator Implant Date: 20220526
Lead Channel Impedance Value: 360 Ohm
Lead Channel Impedance Value: 410 Ohm
Lead Channel Impedance Value: 790 Ohm
Lead Channel Pacing Threshold Amplitude: 0.5 V
Lead Channel Pacing Threshold Amplitude: 0.625 V
Lead Channel Pacing Threshold Amplitude: 1.25 V
Lead Channel Pacing Threshold Pulse Width: 0.5 ms
Lead Channel Pacing Threshold Pulse Width: 0.5 ms
Lead Channel Pacing Threshold Pulse Width: 0.5 ms
Lead Channel Sensing Intrinsic Amplitude: 11.7 mV
Lead Channel Sensing Intrinsic Amplitude: 4.3 mV
Lead Channel Setting Pacing Amplitude: 1.5 V
Lead Channel Setting Pacing Amplitude: 1.625
Lead Channel Setting Pacing Amplitude: 2.5 V
Lead Channel Setting Pacing Pulse Width: 0.5 ms
Lead Channel Setting Pacing Pulse Width: 0.5 ms
Lead Channel Setting Sensing Sensitivity: 0.5 mV
Pulse Gen Serial Number: 111044700
Zone Setting Status: 755011

## 2023-06-18 NOTE — Progress Notes (Signed)
 Remote ICD transmission.

## 2023-06-18 NOTE — Addendum Note (Signed)
 Addended by: Lott Rouleau A on: 06/18/2023 02:07 PM   Modules accepted: Orders

## 2023-08-03 ENCOUNTER — Encounter: Payer: Self-pay | Admitting: Cardiology

## 2023-08-09 ENCOUNTER — Ambulatory Visit

## 2023-08-09 DIAGNOSIS — I447 Left bundle-branch block, unspecified: Secondary | ICD-10-CM | POA: Diagnosis not present

## 2023-08-09 LAB — CUP PACEART REMOTE DEVICE CHECK
Battery Remaining Longevity: 60 mo
Battery Remaining Percentage: 61 %
Battery Voltage: 2.96 V
Brady Statistic AP VP Percent: 7.1 %
Brady Statistic AP VS Percent: 1 %
Brady Statistic AS VP Percent: 92 %
Brady Statistic AS VS Percent: 1 %
Brady Statistic RA Percent Paced: 7.8 %
Date Time Interrogation Session: 20250703020241
HighPow Impedance: 74 Ohm
Implantable Lead Connection Status: 753985
Implantable Lead Connection Status: 753985
Implantable Lead Connection Status: 753985
Implantable Lead Implant Date: 20220526
Implantable Lead Implant Date: 20220526
Implantable Lead Implant Date: 20220930
Implantable Lead Location: 753858
Implantable Lead Location: 753859
Implantable Lead Location: 753860
Implantable Pulse Generator Implant Date: 20220526
Lead Channel Impedance Value: 360 Ohm
Lead Channel Impedance Value: 400 Ohm
Lead Channel Impedance Value: 760 Ohm
Lead Channel Pacing Threshold Amplitude: 0.5 V
Lead Channel Pacing Threshold Amplitude: 0.625 V
Lead Channel Pacing Threshold Amplitude: 1.25 V
Lead Channel Pacing Threshold Pulse Width: 0.5 ms
Lead Channel Pacing Threshold Pulse Width: 0.5 ms
Lead Channel Pacing Threshold Pulse Width: 0.5 ms
Lead Channel Sensing Intrinsic Amplitude: 11.7 mV
Lead Channel Sensing Intrinsic Amplitude: 4.1 mV
Lead Channel Setting Pacing Amplitude: 1.5 V
Lead Channel Setting Pacing Amplitude: 1.625
Lead Channel Setting Pacing Amplitude: 2.5 V
Lead Channel Setting Pacing Pulse Width: 0.5 ms
Lead Channel Setting Pacing Pulse Width: 0.5 ms
Lead Channel Setting Sensing Sensitivity: 0.5 mV
Pulse Gen Serial Number: 111044700
Zone Setting Status: 755011

## 2023-08-13 ENCOUNTER — Ambulatory Visit: Payer: Self-pay | Admitting: Cardiology

## 2023-08-23 ENCOUNTER — Ambulatory Visit

## 2023-08-23 VITALS — BP 180/100 | HR 72 | Ht 70.0 in | Wt 174.0 lb

## 2023-08-23 DIAGNOSIS — I10 Essential (primary) hypertension: Secondary | ICD-10-CM

## 2023-08-23 DIAGNOSIS — I110 Hypertensive heart disease with heart failure: Secondary | ICD-10-CM

## 2023-08-23 DIAGNOSIS — E782 Mixed hyperlipidemia: Secondary | ICD-10-CM | POA: Diagnosis not present

## 2023-08-23 DIAGNOSIS — I5032 Chronic diastolic (congestive) heart failure: Secondary | ICD-10-CM | POA: Diagnosis not present

## 2023-08-23 NOTE — Progress Notes (Signed)
 Cardiology Consultation:    Date:  08/23/2023   ID:  Victor Hood, DOB 06-02-1955, MRN [id number]  PCP:  Victor Nest, MD  Cardiologist:  Victor SAUNDERS Paton Crum, MD   Referring MD: Victor Nest, MD   Chief Complaint  Patient presents with   Annual Exam     ASSESSMENT AND PLAN:   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   Problem List Items Addressed This Visit     Hyperlipidemia   Last lipid panel from 03/15/2022 total cholesterol 152, HDL 41, LDL 91, triglycerides 112. Continue with pravastatin 80 mg 2 times a week as tolerated and continue Zetia  10 mg once daily.       Hypertension - Primary   Suboptimal control likely in the setting of suboptimal dosing of carvedilol  and Entresto  1 time a day.  Adjust the dose to twice a day as discussed under CHF. Maintain blood pressure log for the next 1 week. Will review his log at home after the dose adjustment.  Will have him come back for a follow-up visit in a month to review medication compliance and blood pressures.       CHF (congestive heart failure) (HCC)   CHF with previously reduced LVEF down to 30 to 35% in 2021. Nonischemic cardiomyopathy with mild to moderate nonobstructive CAD on cardiac CT December 2021 S/p CRT-D May 2022. Recovered LVEF on follow-up TTE 02/02/2021 with EF 60 to 65%.  Excellent functional status NYHA class I. Not on any loop diuretic. Remains on low-salt diet.  Guideline directed medical therapy is currently on carvedilol  25 mg, Entresto  97 mg - 103 mg, taking these 2 medicines once a day rather than twice a day as prescribed as he missed understood recommendation to cut back on the medications for blood pressure. Advised him to increase carvedilol  back to 25 mg  twice daily. Increase Entresto  back to 97 mg - 103 mg twice daily. Continue eplerenone  25 mg once daily. Advised him to keep a log of blood pressure readings over the next 1 week and send our office a message.       Return to clinic in 1 month  History of Present Illness:    Victor Hood is a 68 y.o. male who is being seen today for follow-up visit. PCP is Victor Nest, MD. Last visit with us  in the office was 03/15/2022 with Victor Hood. Follows up with electrophysiologist Dr. Inocencio.  Pleasant man here for the visit by himself.  Works on his own chicken farm with 4 buildings, work involves 7 days a week of physical activity.  He typically does not use a mask.  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  Mentions overall he has been doing well good functional status.  No significant limitations with his work on his farm.  Compliant with his medications however mentions after his last visit he self adjusted the dose down office carvedilol  and Entresto  to 1 time a day. He has not been checking his blood pressures recently at home.  Here in the office blood pressures have been significantly elevated today.  EKG in the clinic today atrial sensed  ventricular paced rhythm.  Last pacer check 08/09/2023 with normal device function.  1 short run of NSVT reported.  99% ventricular paced continues to follow-up with device clinic.  Past Medical History:  Diagnosis Date   Chronic left systolic heart failure (HCC) 08/02/2017   Hyperlipidemia 08/02/2017   Hypertensive heart disease with heart failure (HCC) 08/02/2017   Kidney stone 08/02/2017   Metabolic syndrome 08/02/2017   Statin intolerance 07/04/2016    Past Surgical History:  Procedure Laterality Date   BACK SURGERY  2007    decrompressed herniated disc   BIV ICD INSERTION CRT-D N/A 07/01/2020   Procedure: BIV ICD INSERTION CRT-D;  Surgeon: Victor Hood Soyla Lunger, MD;  Location: [facility] INVASIVE CV LAB;  Service: Cardiovascular;  Laterality: N/A;   FOREARM FRACTURE SURGERY Left 1976   HUMERUS FRACTURE SURGERY Left 1976   LEAD REVISION/REPAIR N/A 11/05/2020   Procedure: LEAD REVISION/REPAIR;  Surgeon: Victor Hood Soyla Lunger, MD;  Location: [facility] INVASIVE CV LAB;  Service: Cardiovascular;  Laterality: N/A;    Current Medications: Current Meds  Medication Sig   aspirin EC 81 MG tablet Take 81 mg by mouth daily.   carvedilol  (COREG ) 25 MG tablet TAKE 1 TABLET BY MOUTH TWICE DAILY WITH A MEAL   eplerenone  (INSPRA ) 25 MG tablet Take 1 tablet (25 mg total) by mouth daily. 1rst attempt, patient needs an appt for additional refills   ezetimibe  (ZETIA ) 10 MG tablet Take 1 tablet (10 mg total) by mouth daily. Patient needs to make an appointment with Victor Hood for further refills   Multiple Vitamins-Minerals (MULTIVITAMIN WITH MINERALS) tablet Take 1 tablet by mouth 3 (three) times a week.   pravastatin (PRAVACHOL) 80 MG tablet Take 80 mg by mouth 2 (two) times a week.   sacubitril-valsartan (ENTRESTO ) 97-103 MG Take 1 tablet by mouth 2 (two) times daily.     Allergies:   Tape   Social History   Socioeconomic History   Marital status: Married    Spouse name: Not on file   Number of children: Not on file   Years of education: Not on file   Highest education level: Not on file  Occupational History   Not on file  Tobacco Use   Smoking status: Never    Passive exposure: Never   Smokeless tobacco: Never  Vaping Use   Vaping status: Never Used  Substance and Sexual Activity   Alcohol use: Yes    Alcohol/week: 7.0 - 14.0 standard drinks of alcohol    Types: 7 - 14 Cans of beer per week   Drug use: Never   Sexual activity: Not on file  Other Topics Concern   Not on file  Social History Narrative   Not on file   Social  Drivers of Health   Financial Resource Strain: Not on file  Food Insecurity: Not on file  Transportation Needs: Not on file  Physical Activity: Not on file  Stress: Not on file  Social Connections: Not on file     Family History: The patient's family history includes CVA in his mother; Congestive Heart Failure in his father and mother; Diabetes in his mother; Hypercholesterolemia in his maternal grandfather, maternal grandmother, and mother; Pancreatic cancer in his paternal uncle; Prostate cancer in his paternal uncle. ROS:   Please see the history of present illness.    All 14 point review of systems negative except as described per history of present illness.  EKGs/Labs/Other Studies Reviewed:    The following studies were reviewed today:  EKG:  EKG Interpretation Date/Time:  Thursday August 23 2023 16:33:51 EDT Ventricular Rate:  72 PR Interval:  160 QRS Duration:  136 QT Interval:  408 QTC Calculation: 446 R Axis:   142  Text Interpretation: Atrial-sensed ventricular-paced rhythm Biventricular pacemaker detected Abnormal ECG When compared with ECG of 05-Nov-2020 17:57, Vent. rate has increased BY   2 BPM Confirmed by Liborio Hai reddy [phone]) on 08/23/2023 5:13:18 PM    Recent Labs: No results found for requested labs within last 365 days.  Recent Lipid Panel    Component Value Date/Time   CHOL 152 03/15/2022 1048   TRIG 112 03/15/2022 1048   HDL 41 03/15/2022 1048   CHOLHDL 3.7 03/15/2022 1048   LDLCALC 91 03/15/2022 1048    Physical Exam:    VS:  BP (!) 180/100   Pulse 72   Ht 5' 10 (1.778 m)   Wt 174 lb (78.9 kg)   SpO2 98%   BMI 24.97 kg/m     Wt Readings from Last 3 Encounters:  08/23/23 174 lb (78.9 kg)  03/15/22 182 lb (82.6 kg)  02/27/22 183 lb (83 kg)     GENERAL:  Well nourished, well developed in no acute distress NECK: No JVD; No carotid bruits CARDIAC: RRR, S1 and S2 present, no murmurs, no rubs, no gallops CHEST:  Clear to  auscultation without rales, wheezing or rhonchi  Extremities: No pitting pedal edema. Pulses bilaterally symmetric with radial 2+ and dorsalis pedis 2+ NEUROLOGIC:  Alert and oriented x 3  Medication Adjustments/Labs and Tests Ordered: Current medicines are reviewed at length with the patient today.  Concerns regarding medicines are outlined above.  Orders Placed This Encounter  Procedures   EKG 12-Lead   No orders of the defined types were placed in this encounter.   Signed, Hai jess Liborio, MD, MPH, [facility]. 08/23/2023 5:21 PM    [facility]

## 2023-08-23 NOTE — Patient Instructions (Addendum)
 Medication Instructions:  Your physician recommends that you continue on your current medications as directed. Please refer to the Current Medication list given to you today.  *If you need a refill on your cardiac medications before your next appointment, please call your pharmacy*  Lab Work: None If you have labs (blood work) drawn today and your tests are completely normal, you will receive your results only by: MyChart Message (if you have MyChart) OR A paper copy in the mail If you have any lab test that is abnormal or we need to change your treatment, we will call you to review the results.  Testing/Procedures: None  Follow-Up: At [facility], you and your health needs are our priority.  As part of our continuing mission to provide you with exceptional heart care, our providers are all part of one team.  This team includes your primary Cardiologist (physician) and Advanced Practice Providers or APPs (Physician Assistants and Nurse Practitioners) who all work together to provide you with the care you need, when you need it.  Your next appointment:   1 month(s)  Provider:   Alean Kobus, MD    We recommend signing up for the patient portal called MyChart.  Sign up information is provided on this After Visit Summary.  MyChart is used to connect with patients for Virtual Visits (Telemedicine).  Patients are able to view lab/test results, encounter notes, upcoming appointments, etc.  Non-urgent messages can be sent to your provider as well.   To learn more about what you can do with MyChart, go to [email or web address].   Other Instructions Please take Entresto  and Carvedilol  two times per day.  Please keep a BP log for 2 weeks and send by MyChart or mail.                          Name and DOB__________________________ Dr. Madireddy [address] [place], [place] [address]  Blood Pressure Record Sheet To take your blood pressure, you will need a blood pressure  machine. You can buy a blood pressure machine (blood pressure monitor) at your clinic, drug store, or online. When choosing one, consider: An automatic monitor that has an arm cuff. A cuff that wraps snugly around your upper arm. You should be able to fit only one finger between your arm and the cuff. A device that stores blood pressure reading results. Do not choose a monitor that measures your blood pressure from your wrist or finger. Follow your health care provider's instructions for how to take your blood pressure. To use this form: Get one reading in the morning (a.m.) 1-2 hours after you take any medicines. Get one reading in the evening (p.m.) before supper.   Blood pressure log Date: _______________________  a.m. _____________________(1st reading) HR___________            p.m. _____________________(2nd reading) HR__________  Date: _______________________  a.m. _____________________(1st reading) HR___________            p.m. _____________________(2nd reading) HR__________  Date: _______________________  a.m. _____________________(1st reading) HR___________            p.m. _____________________(2nd reading) HR__________  Date: _______________________  a.m. _____________________(1st reading) HR___________            p.m. _____________________(2nd reading) HR__________  Date: _______________________  a.m. _____________________(1st reading) HR___________            p.m. _____________________(2nd reading) HR__________  Date: _______________________  a.m. _____________________(1st reading) HR___________  p.m. _____________________(2nd reading) HR__________  Date: _______________________  a.m. _____________________(1st reading) HR___________            p.m. _____________________(2nd reading) HR__________   This information is not intended to replace advice given to you by your health care provider. Make sure you discuss any questions you have with your  health care provider. Document Revised: 05/14/2019 Document Reviewed: 05/14/2019 Elsevier Patient Education  2021 [employer].

## 2023-08-23 NOTE — Assessment & Plan Note (Signed)
 Suboptimal control likely in the setting of suboptimal dosing of carvedilol  and Entresto  1 time a day.  Adjust the dose to twice a day as discussed under CHF. Maintain blood pressure log for the next 1 week. Will review his log at home after the dose adjustment.  Will have him come back for a follow-up visit in a month to review medication compliance and blood pressures.

## 2023-08-23 NOTE — Assessment & Plan Note (Signed)
 Last lipid panel from 03/15/2022 total cholesterol 152, HDL 41, LDL 91, triglycerides 112. Continue with pravastatin 80 mg 2 times a week as tolerated and continue Zetia  10 mg once daily.

## 2023-08-23 NOTE — Assessment & Plan Note (Signed)
 CHF with previously reduced LVEF down to 30 to 35% in 2021. Nonischemic cardiomyopathy with mild to moderate nonobstructive CAD on cardiac CT December 2021 S/p CRT-D May 2022. Recovered LVEF on follow-up TTE 02/02/2021 with EF 60 to 65%.  Excellent functional status NYHA class I. Not on any loop diuretic. Remains on low-salt diet.  Guideline directed medical therapy is currently on carvedilol  25 mg, Entresto  97 mg - 103 mg, taking these 2 medicines once a day rather than twice a day as prescribed as he missed understood recommendation to cut back on the medications for blood pressure. Advised him to increase carvedilol  back to 25 mg twice daily. Increase Entresto  back to 97 mg - 103 mg twice daily. Continue eplerenone  25 mg once daily. Advised him to keep a log of blood pressure readings over the next 1 week and send our office a message.

## 2023-09-21 ENCOUNTER — Ambulatory Visit: Admitting: Cardiology

## 2023-09-27 ENCOUNTER — Ambulatory Visit

## 2023-10-16 ENCOUNTER — Ambulatory Visit

## 2023-10-16 VITALS — BP 160/72 | HR 60 | Ht 70.0 in | Wt 173.2 lb

## 2023-10-16 DIAGNOSIS — I10 Essential (primary) hypertension: Secondary | ICD-10-CM

## 2023-10-16 MED ORDER — AMLODIPINE BESYLATE 2.5 MG PO TABS: 2.5000 mg | ORAL_TABLET | Freq: Every day | ORAL | 3 refills | Status: AC

## 2023-10-16 NOTE — Assessment & Plan Note (Addendum)
 Suboptimal blood pressure readings. Continue carvedilol  25 mg twice daily. Continue Entresto  97-103 mg twice daily Continue eplerenone  25 mg once daily. Target blood pressure readings below 130/80 mmHg. Will further add low-dose amlodipine  2.5 mg once daily, discussed medications action and potential side effects of his ankle edema, lightheadedness. He is agreeable to start. Advised him to continue to keep a log of blood pressure readings and send it to us  over the next 1 to 2 weeks.

## 2023-10-16 NOTE — Progress Notes (Signed)
 Cardiology Consultation:    Date:  10/16/2023   ID:  Victor Hood, DOB Mar 16, 1955, MRN [id number]  PCP:  Clemmie Nest, MD  Cardiologist:  Alean SAUNDERS Oryn Casanova, MD   Referring MD: Clemmie Nest, MD   No chief complaint on file.    ASSESSMENT AND PLAN:   Victor Hood 68 year old male history of CAD with mild to moderate nonobstructive disease by cardiac CT December 2021, CHF with recovered LVEF after CRT therapy May 2022 with echocardiogram 02/02/2021 noting EF 60 to 65%, LBBB underlying, hypertension, hyperlipidemia.   Here for follow-up visit.  Problem List Items Addressed This Visit     Hypertension - Primary   Suboptimal blood pressure readings. Continue carvedilol  25 mg twice daily. Continue Entresto  97-103 mg twice daily Continue eplerenone  25 mg once daily. Target blood pressure readings below 130/80 mmHg. Will further add low-dose amlodipine  2.5 mg once daily, discussed medications action and potential side effects of his ankle edema, lightheadedness. He is agreeable to start. Advised him to continue to keep a log of blood pressure readings and send it to us  over the next 1 to 2 weeks.      Relevant Medications   amLODipine  (NORVASC ) 2.5 MG tablet   Return to clinic tentatively in 6 months   History of Present Illness:    Victor Hood is a 68 y.o. male who is being seen today for a follow-up visit. PCP is Clemmie Nest, MD. Last visit with me in the office was 08/23/2023. Previously followed up with Dr. Monetta. Also follows up with Dr. Inocencio.  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  At last office visit blood pressures were noted to be  elevated as he was taking Entresto  and carvedilol  once a day.  This was readjusted and educated him about taking it twice a day.  Mentions blood pressure since the change at last office visit taking his medications as prescribed has helped with the blood pressure readings. Mentions that at 10 past blood pressures were systolic 120s over diastolic in 80s.  And highest blood pressures were  systolic 170s. Today at the office visit blood pressure still remain elevated. Mentions blood pressures were more on the elevated side compared to below 130 at home. He did not bring his log today. But overall he feels good and no symptoms. Feels his energy levels have improved.    Past Medical History:  Diagnosis Date   CHF (congestive heart failure) (HCC) 08/02/2017   Chronic left systolic heart failure (HCC) 08/02/2017   Hyperlipidemia 08/02/2017   Hypertension 08/02/2017   Hypertensive heart disease with heart failure (HCC) 08/02/2017   Kidney stone 08/02/2017   Metabolic syndrome 08/02/2017   Statin intolerance 07/04/2016    Past Surgical History:  Procedure Laterality Date   BACK SURGERY  2007   decrompressed herniated disc   BIV ICD INSERTION CRT-D N/A 07/01/2020   Procedure: BIV ICD INSERTION CRT-D;  Surgeon: Inocencio Soyla Lunger, MD;  Location: [facility] INVASIVE CV LAB;  Service: Cardiovascular;  Laterality: N/A;   FOREARM FRACTURE SURGERY Left 1976   HUMERUS FRACTURE SURGERY Left 1976   LEAD REVISION/REPAIR N/A 11/05/2020   Procedure: LEAD REVISION/REPAIR;  Surgeon: Inocencio Soyla Lunger, MD;  Location: [facility] INVASIVE CV LAB;  Service: Cardiovascular;  Laterality: N/A;    Current Medications: Current Meds  Medication Sig   amLODipine  (NORVASC ) 2.5 MG tablet Take 1 tablet (2.5 mg total) by mouth daily.   aspirin EC 81 MG tablet Take 81 mg by mouth daily.   carvedilol  (COREG ) 25 MG tablet TAKE 1 TABLET BY MOUTH TWICE DAILY WITH A MEAL   eplerenone  (INSPRA ) 25 MG tablet Take 1 tablet (25 mg  total) by mouth daily. 1rst attempt, patient needs an appt for additional refills   ezetimibe  (ZETIA ) 10 MG tablet Take 1 tablet (10 mg total) by mouth daily. Patient needs to make an appointment with Dr. Monetta for further refills   Multiple Vitamins-Minerals (MULTIVITAMIN WITH MINERALS) tablet Take 1 tablet by mouth 3 (three) times a week.   pravastatin (PRAVACHOL) 80 MG tablet Take 80 mg by mouth 2 (two) times a week.   sacubitril-valsartan (ENTRESTO ) 97-103 MG Take 1 tablet by mouth 2 (two) times daily.     Allergies:   Tape   Social History   Socioeconomic History   Marital status: Married    Spouse name: Not on file   Number of children: Not on file   Years of education: Not on file   Highest education level: Not on file  Occupational History   Not on file  Tobacco Use   Smoking status: Never    Passive exposure: Never   Smokeless tobacco: Never  Vaping Use   Vaping status: Never Used  Substance and Sexual Activity   Alcohol use: Yes    Alcohol/week: 7.0 - 14.0 standard drinks of alcohol    Types: 7 - 14 Cans of beer per week   Drug use: Never   Sexual activity: Not on file  Other Topics Concern   Not on file  Social History Narrative   Not on file   Social Drivers of Health   Financial Resource Strain: Not on file  Food Insecurity: Not on file  Transportation Needs: Not on file  Physical Activity: Not on file  Stress: Not on file  Social Connections: Not on file     Family History: The patient's family history includes CVA in his mother; Congestive Heart Failure in his father and mother; Diabetes in his mother; Hypercholesterolemia in his maternal grandfather, maternal grandmother, and mother; Pancreatic cancer in his paternal uncle; Prostate cancer in his paternal uncle. ROS:   Please see the history of present illness.    All 14 point review of systems negative except as described per history of present illness.  EKGs/Labs/Other Studies Reviewed:    The  following studies were reviewed today:   EKG:       Recent Labs: No results found for requested labs within last 365 days.  Recent Lipid Panel    Component Value Date/Time   CHOL 152 03/15/2022 1048   TRIG 112 03/15/2022 1048   HDL 41 03/15/2022 1048   CHOLHDL 3.7 03/15/2022 1048   LDLCALC 91 03/15/2022 1048    Physical Exam:    VS:  BP (!) 160/72   Pulse 60   Ht 5' 10 (1.778 m)   Wt 173 lb 3.2 oz (78.6 kg)   SpO2 96%   BMI 24.85 kg/m     Wt Readings from Last 3 Encounters:  10/16/23 173 lb 3.2 oz (78.6 kg)  08/23/23 174 lb (78.9 kg)  03/15/22 182 lb (82.6 kg)     GENERAL:  Well nourished, well developed in  no acute distress NECK: No JVD; No carotid bruits CARDIAC: RRR, S1 and S2 present, no murmurs, no rubs, no gallops Prescription extremities: No pitting pedal edema. Pulses bilaterally symmetric with radial 2+ and dorsalis pedis 2+ NEUROLOGIC:  Alert and oriented x 3  Medication Adjustments/Labs and Tests Ordered: Current medicines are reviewed at length with the patient today.  Concerns regarding medicines are outlined above.  No orders of the defined types were placed in this encounter.  Meds ordered this encounter  Medications   amLODipine  (NORVASC ) 2.5 MG tablet    Sig: Take 1 tablet (2.5 mg total) by mouth daily.    Dispense:  90 tablet    Refill:  3    Signed, Haili Donofrio reddy Noreen Mackintosh, MD, MPH, [facility] 10/16/2023 4:18 PM    [facility]

## 2023-10-16 NOTE — Patient Instructions (Signed)
 Medication Instructions:  Your physician has recommended you make the following change in your medication:   START: Amlodipine  2.5 mg daily  *If you need a refill on your cardiac medications before your next appointment, please call your pharmacy*  Lab Work: None If you have labs (blood work) drawn today and your tests are completely normal, you will receive your results only by: MyChart Message (if you have MyChart) OR A paper copy in the mail If you have any lab test that is abnormal or we need to change your treatment, we will call you to review the results.  Testing/Procedures: None  Follow-Up: At [facility], you and your health needs are our priority.  As part of our continuing mission to provide you with exceptional heart care, our providers are all part of one team.  This team includes your primary Cardiologist (physician) and Advanced Practice Providers or APPs (Physician Assistants and Nurse Practitioners) who all work together to provide you with the care you need, when you need it.  Your next appointment:   6 month(s)  Provider:   Alean Kobus, MD    We recommend signing up for the patient portal called MyChart.  Sign up information is provided on this After Visit Summary.  MyChart is used to connect with patients for Virtual Visits (Telemedicine).  Patients are able to view lab/test results, encounter notes, upcoming appointments, etc.  Non-urgent messages can be sent to your provider as well.   To learn more about what you can do with MyChart, go to [email or web address].   Other Instructions Please keep a BP log for 2 weeks and send by MyChart or mail.                          Name and DOB__________________________ Dr. Madireddy [address] [place], [place] [address]  Blood Pressure Record Sheet To take your blood pressure, you will need a blood pressure machine. You can buy a blood pressure machine (blood pressure monitor) at your clinic, drug  store, or online. When choosing one, consider: An automatic monitor that has an arm cuff. A cuff that wraps snugly around your upper arm. You should be able to fit only one finger between your arm and the cuff. A device that stores blood pressure reading results. Do not choose a monitor that measures your blood pressure from your wrist or finger. Follow your health care provider's instructions for how to take your blood pressure. To use this form: Get one reading in the morning (a.m.) 1-2 hours after you take any medicines. Get one reading in the evening (p.m.) before supper.   Blood pressure log Date: _______________________  a.m. _____________________(1st reading) HR___________            p.m. _____________________(2nd reading) HR__________  Date: _______________________  a.m. _____________________(1st reading) HR___________            p.m. _____________________(2nd reading) HR__________  Date: _______________________  a.m. _____________________(1st reading) HR___________            p.m. _____________________(2nd reading) HR__________  Date: _______________________  a.m. _____________________(1st reading) HR___________            p.m. _____________________(2nd reading) HR__________  Date: _______________________  a.m. _____________________(1st reading) HR___________            p.m. _____________________(2nd reading) HR__________  Date: _______________________  a.m. _____________________(1st reading) HR___________            p.m. _____________________(2nd reading) HR__________  Date: _______________________  a.m. _____________________(1st reading) HR___________            p.m. _____________________(2nd reading) HR__________   This information is not intended to replace advice given to you by your health care provider. Make sure you discuss any questions you have with your health care provider. Document Revised: 05/14/2019 Document Reviewed: 05/14/2019 Elsevier  Patient Education  2021 [employer].

## 2023-11-01 ENCOUNTER — Other Ambulatory Visit: Payer: Self-pay | Admitting: Cardiology

## 2023-11-08 ENCOUNTER — Ambulatory Visit

## 2023-11-08 DIAGNOSIS — I5032 Chronic diastolic (congestive) heart failure: Secondary | ICD-10-CM

## 2023-11-09 LAB — CUP PACEART REMOTE DEVICE CHECK
Battery Remaining Longevity: 57 mo
Battery Remaining Percentage: 58 %
Battery Voltage: 2.95 V
Brady Statistic AP VP Percent: 7.4 %
Brady Statistic AP VS Percent: 1 %
Brady Statistic AS VP Percent: 91 %
Brady Statistic AS VS Percent: 1 %
Brady Statistic RA Percent Paced: 8 %
Date Time Interrogation Session: 20251002022151
HighPow Impedance: 73 Ohm
Implantable Lead Connection Status: 753985
Implantable Lead Connection Status: 753985
Implantable Lead Connection Status: 753985
Implantable Lead Implant Date: 20220526
Implantable Lead Implant Date: 20220526
Implantable Lead Implant Date: 20220930
Implantable Lead Location: 753858
Implantable Lead Location: 753859
Implantable Lead Location: 753860
Implantable Pulse Generator Implant Date: 20220526
Lead Channel Impedance Value: 360 Ohm
Lead Channel Impedance Value: 440 Ohm
Lead Channel Impedance Value: 790 Ohm
Lead Channel Pacing Threshold Amplitude: 0.625 V
Lead Channel Pacing Threshold Amplitude: 0.75 V
Lead Channel Pacing Threshold Amplitude: 1.25 V
Lead Channel Pacing Threshold Pulse Width: 0.5 ms
Lead Channel Pacing Threshold Pulse Width: 0.5 ms
Lead Channel Pacing Threshold Pulse Width: 0.5 ms
Lead Channel Sensing Intrinsic Amplitude: 11.7 mV
Lead Channel Sensing Intrinsic Amplitude: 4.1 mV
Lead Channel Setting Pacing Amplitude: 1.625
Lead Channel Setting Pacing Amplitude: 1.75 V
Lead Channel Setting Pacing Amplitude: 2.5 V
Lead Channel Setting Pacing Pulse Width: 0.5 ms
Lead Channel Setting Pacing Pulse Width: 0.5 ms
Lead Channel Setting Sensing Sensitivity: 0.5 mV
Pulse Gen Serial Number: 111044700
Zone Setting Status: 755011

## 2023-11-11 DIAGNOSIS — W230XXA Caught, crushed, jammed, or pinched between moving objects, initial encounter: Secondary | ICD-10-CM | POA: Diagnosis not present

## 2023-11-11 DIAGNOSIS — R600 Localized edema: Secondary | ICD-10-CM | POA: Diagnosis not present

## 2023-11-11 DIAGNOSIS — S67195A Crushing injury of left ring finger, initial encounter: Secondary | ICD-10-CM | POA: Diagnosis not present

## 2023-11-11 DIAGNOSIS — Z23 Encounter for immunization: Secondary | ICD-10-CM | POA: Diagnosis not present

## 2023-11-12 ENCOUNTER — Ambulatory Visit: Payer: Self-pay | Admitting: Cardiology

## 2023-11-12 NOTE — Progress Notes (Signed)
 Remote ICD Transmission

## 2023-11-15 NOTE — Progress Notes (Signed)
 Remote ICD Transmission

## 2023-11-20 DIAGNOSIS — S62665A Nondisplaced fracture of distal phalanx of left ring finger, initial encounter for closed fracture: Secondary | ICD-10-CM | POA: Diagnosis not present

## 2023-11-26 ENCOUNTER — Ambulatory Visit: Admitting: Cardiology

## 2024-01-21 ENCOUNTER — Other Ambulatory Visit: Payer: Self-pay | Admitting: Cardiology

## 2024-02-08 ENCOUNTER — Ambulatory Visit (INDEPENDENT_AMBULATORY_CARE_PROVIDER_SITE_OTHER)

## 2024-02-08 DIAGNOSIS — I5032 Chronic diastolic (congestive) heart failure: Secondary | ICD-10-CM

## 2024-02-10 NOTE — Progress Notes (Unsigned)
" °  Electrophysiology Office Note:   Date:  02/11/2024  ID:  Victor Hood, DOB 29-May-1955, MRN [id number]  Primary Cardiologist: None Primary Heart Failure: None Electrophysiologist: Rotunda Worden Gladis Norton, MD      History of Present Illness:   Victor Hood is a 69 y.o. male with h/o chronic systolic heart failure, hypertension, hyperlipidemia seen today for routine electrophysiology followup.   Discussed the use of AI scribe software for clinical note transcription with the patient, who gave verbal consent to proceed.  History of Present Illness He is a 69 year old male with a defibrillator who presents for routine follow-up.  He is doing well with no new issues or concerns. He feels better and is satisfied with his current condition.  There have been no reported symptoms or complications related to his defibrillator use.  He was informed that his blood pressure was slightly elevated, but his heart rate was stable.    he denies chest pain, palpitations, dyspnea, PND, orthopnea, nausea, vomiting, dizziness, syncope, edema, weight gain, or early satiety.   Review of systems complete and found to be negative unless listed in HPI.      EP Information / Studies Reviewed:    EKG is not ordered today. EKG from 08/23/2023 reviewed which showed atrial sensed, ventricular paced      ICD Interrogation-  reviewed in detail today,  See PACEART report.  Device History: Abbott BiV ICD implanted 07/01/2020 for chronic systolic heart failure History of appropriate therapy: No History of AAD therapy: No   Risk Assessment/Calculations:            Physical Exam:   VS:  BP (!) 140/88 (BP Location: Left Arm, Patient Position: Sitting, Cuff Size: Normal)   Pulse 64   Ht 5' 10 (1.778 m)   Wt 167 lb (75.8 kg)   SpO2 96%   BMI 23.96 kg/m    Wt Readings from Last 3 Encounters:  02/11/24 167 lb (75.8 kg)  10/16/23 173 lb 3.2 oz (78.6 kg)  08/23/23 174 lb (78.9 kg)     GEN: Well  nourished, well developed in no acute distress NECK: No JVD; No carotid bruits CARDIAC: Regular rate and rhythm, no murmurs, rubs, gallops RESPIRATORY:  Clear to auscultation without rales, wheezing or rhonchi  ABDOMEN: Soft, non-tender, non-distended EXTREMITIES:  No edema; No deformity   ASSESSMENT AND PLAN:    Chronic systolic dysfunction s/p Abbott CRT-D  euvolemic today Stable on an appropriate medical regimen Normal ICD function See Pace Art report Ejection fraction recovered with biventricular pacing No changes today  2.  Hypertension: mildly elevated, plan per PCP and primary cardiology  3.  Coronary artery disease: No flow-limiting stenosis on coronary CT.  Plan per primary cardiology.  Disposition:   Follow up with EP Team in 12 months   Signed, Donne Baley Gladis Norton, MD  "

## 2024-02-11 ENCOUNTER — Encounter: Payer: Self-pay | Admitting: Cardiology

## 2024-02-11 ENCOUNTER — Ambulatory Visit: Payer: Self-pay | Admitting: Cardiology

## 2024-02-11 ENCOUNTER — Ambulatory Visit: Attending: Cardiology | Admitting: Cardiology

## 2024-02-11 VITALS — BP 140/88 | HR 64 | Ht 70.0 in | Wt 167.0 lb

## 2024-02-11 DIAGNOSIS — I5022 Chronic systolic (congestive) heart failure: Secondary | ICD-10-CM | POA: Diagnosis not present

## 2024-02-11 DIAGNOSIS — I10 Essential (primary) hypertension: Secondary | ICD-10-CM | POA: Diagnosis not present

## 2024-02-11 DIAGNOSIS — I2510 Atherosclerotic heart disease of native coronary artery without angina pectoris: Secondary | ICD-10-CM | POA: Diagnosis not present

## 2024-02-11 LAB — CUP PACEART INCLINIC DEVICE CHECK
Battery Remaining Longevity: 52 mo
Brady Statistic RA Percent Paced: 8.4 %
Brady Statistic RV Percent Paced: 99 %
Date Time Interrogation Session: 20260105170157
HighPow Impedance: 70.875
Implantable Lead Connection Status: 753985
Implantable Lead Connection Status: 753985
Implantable Lead Connection Status: 753985
Implantable Lead Implant Date: 20220526
Implantable Lead Implant Date: 20220526
Implantable Lead Implant Date: 20220930
Implantable Lead Location: 753858
Implantable Lead Location: 753859
Implantable Lead Location: 753860
Implantable Pulse Generator Implant Date: 20220526
Lead Channel Impedance Value: 375 Ohm
Lead Channel Impedance Value: 437.5 Ohm
Lead Channel Impedance Value: 737.5 Ohm
Lead Channel Pacing Threshold Amplitude: 0.75 V
Lead Channel Pacing Threshold Amplitude: 0.75 V
Lead Channel Pacing Threshold Amplitude: 0.75 V
Lead Channel Pacing Threshold Amplitude: 0.75 V
Lead Channel Pacing Threshold Amplitude: 1 V
Lead Channel Pacing Threshold Amplitude: 1 V
Lead Channel Pacing Threshold Pulse Width: 0.5 ms
Lead Channel Pacing Threshold Pulse Width: 0.5 ms
Lead Channel Pacing Threshold Pulse Width: 0.5 ms
Lead Channel Pacing Threshold Pulse Width: 0.5 ms
Lead Channel Pacing Threshold Pulse Width: 0.5 ms
Lead Channel Pacing Threshold Pulse Width: 0.5 ms
Lead Channel Sensing Intrinsic Amplitude: 4.2 mV
Lead Channel Sensing Intrinsic Amplitude: 9.2 mV
Lead Channel Setting Pacing Amplitude: 1.625
Lead Channel Setting Pacing Amplitude: 1.75 V
Lead Channel Setting Pacing Amplitude: 2.5 V
Lead Channel Setting Pacing Pulse Width: 0.5 ms
Lead Channel Setting Pacing Pulse Width: 0.5 ms
Lead Channel Setting Sensing Sensitivity: 0.5 mV
Pulse Gen Serial Number: 111044700
Zone Setting Status: 755011

## 2024-02-11 LAB — CUP PACEART REMOTE DEVICE CHECK
Battery Remaining Longevity: 55 mo
Battery Remaining Percentage: 56 %
Battery Voltage: 2.95 V
Brady Statistic AP VP Percent: 7.9 %
Brady Statistic AP VS Percent: 1 %
Brady Statistic AS VP Percent: 91 %
Brady Statistic AS VS Percent: 1 %
Brady Statistic RA Percent Paced: 8.5 %
Date Time Interrogation Session: 20260102223356
HighPow Impedance: 71 Ohm
Implantable Lead Connection Status: 753985
Implantable Lead Connection Status: 753985
Implantable Lead Connection Status: 753985
Implantable Lead Implant Date: 20220526
Implantable Lead Implant Date: 20220526
Implantable Lead Implant Date: 20220930
Implantable Lead Location: 753858
Implantable Lead Location: 753859
Implantable Lead Location: 753860
Implantable Pulse Generator Implant Date: 20220526
Lead Channel Impedance Value: 380 Ohm
Lead Channel Impedance Value: 440 Ohm
Lead Channel Impedance Value: 790 Ohm
Lead Channel Pacing Threshold Amplitude: 0.625 V
Lead Channel Pacing Threshold Amplitude: 0.75 V
Lead Channel Pacing Threshold Amplitude: 1.25 V
Lead Channel Pacing Threshold Pulse Width: 0.5 ms
Lead Channel Pacing Threshold Pulse Width: 0.5 ms
Lead Channel Pacing Threshold Pulse Width: 0.5 ms
Lead Channel Sensing Intrinsic Amplitude: 11.6 mV
Lead Channel Sensing Intrinsic Amplitude: 3.9 mV
Lead Channel Setting Pacing Amplitude: 1.625
Lead Channel Setting Pacing Amplitude: 1.75 V
Lead Channel Setting Pacing Amplitude: 2.5 V
Lead Channel Setting Pacing Pulse Width: 0.5 ms
Lead Channel Setting Pacing Pulse Width: 0.5 ms
Lead Channel Setting Sensing Sensitivity: 0.5 mV
Pulse Gen Serial Number: 111044700
Zone Setting Status: 755011

## 2024-02-12 ENCOUNTER — Ambulatory Visit: Payer: Self-pay | Admitting: Cardiology

## 2024-02-12 NOTE — Progress Notes (Signed)
 Remote ICD Transmission

## 2024-05-08 ENCOUNTER — Encounter
